# Patient Record
Sex: Female | Born: 1960 | Race: White | Hispanic: No | Smoking: Current every day smoker
Health system: Southern US, Community
[De-identification: ages and names within clinical notes are randomized; demographics above are authoritative.]

## PROBLEM LIST (undated history)

## (undated) DIAGNOSIS — I10 Essential (primary) hypertension: Secondary | ICD-10-CM

## (undated) DIAGNOSIS — T7840XA Allergy, unspecified, initial encounter: Secondary | ICD-10-CM

## (undated) DIAGNOSIS — M5020 Other cervical disc displacement, unspecified cervical region: Secondary | ICD-10-CM

## (undated) DIAGNOSIS — E785 Hyperlipidemia, unspecified: Secondary | ICD-10-CM

## (undated) DIAGNOSIS — F32A Depression, unspecified: Secondary | ICD-10-CM

## (undated) DIAGNOSIS — R51 Headache: Secondary | ICD-10-CM

## (undated) DIAGNOSIS — R519 Headache, unspecified: Secondary | ICD-10-CM

## (undated) DIAGNOSIS — B019 Varicella without complication: Secondary | ICD-10-CM

## (undated) DIAGNOSIS — F329 Major depressive disorder, single episode, unspecified: Secondary | ICD-10-CM

## (undated) HISTORY — DX: Major depressive disorder, single episode, unspecified: F32.9

## (undated) HISTORY — PX: APPENDECTOMY: SHX54

## (undated) HISTORY — DX: Allergy, unspecified, initial encounter: T78.40XA

## (undated) HISTORY — DX: Headache, unspecified: R51.9

## (undated) HISTORY — DX: Other cervical disc displacement, unspecified cervical region: M50.20

## (undated) HISTORY — DX: Headache: R51

## (undated) HISTORY — DX: Depression, unspecified: F32.A

## (undated) HISTORY — DX: Varicella without complication: B01.9

## (undated) HISTORY — DX: Hyperlipidemia, unspecified: E78.5

## (undated) HISTORY — DX: Essential (primary) hypertension: I10

## (undated) HISTORY — PX: DILATION AND CURETTAGE OF UTERUS: SHX78

---

## 1998-11-16 ENCOUNTER — Other Ambulatory Visit: Admission: RE | Admit: 1998-11-16 | Discharge: 1998-11-16 | Payer: Self-pay | Admitting: *Deleted

## 2000-03-30 ENCOUNTER — Other Ambulatory Visit: Admission: RE | Admit: 2000-03-30 | Discharge: 2000-03-30 | Payer: Self-pay | Admitting: Obstetrics and Gynecology

## 2005-04-15 ENCOUNTER — Ambulatory Visit: Payer: Self-pay | Admitting: Obstetrics and Gynecology

## 2006-04-27 ENCOUNTER — Ambulatory Visit: Payer: Self-pay | Admitting: Obstetrics and Gynecology

## 2007-10-05 ENCOUNTER — Ambulatory Visit: Payer: Self-pay | Admitting: Gastroenterology

## 2007-10-30 ENCOUNTER — Ambulatory Visit: Payer: Self-pay | Admitting: Gastroenterology

## 2008-06-24 ENCOUNTER — Ambulatory Visit: Payer: Self-pay | Admitting: Obstetrics and Gynecology

## 2013-09-25 ENCOUNTER — Ambulatory Visit: Payer: Self-pay | Admitting: Unknown Physician Specialty

## 2013-10-01 ENCOUNTER — Other Ambulatory Visit: Payer: Self-pay | Admitting: Family

## 2013-10-01 ENCOUNTER — Encounter: Payer: Self-pay | Admitting: Family

## 2013-10-01 ENCOUNTER — Ambulatory Visit (INDEPENDENT_AMBULATORY_CARE_PROVIDER_SITE_OTHER): Payer: 59 | Admitting: Family

## 2013-10-01 VITALS — BP 110/80 | HR 90 | Ht 64.0 in | Wt 131.0 lb

## 2013-10-01 DIAGNOSIS — F329 Major depressive disorder, single episode, unspecified: Secondary | ICD-10-CM

## 2013-10-01 DIAGNOSIS — F411 Generalized anxiety disorder: Secondary | ICD-10-CM

## 2013-10-01 DIAGNOSIS — R51 Headache: Secondary | ICD-10-CM

## 2013-10-01 DIAGNOSIS — I10 Essential (primary) hypertension: Secondary | ICD-10-CM

## 2013-10-01 DIAGNOSIS — F32A Depression, unspecified: Secondary | ICD-10-CM | POA: Insufficient documentation

## 2013-10-01 DIAGNOSIS — J309 Allergic rhinitis, unspecified: Secondary | ICD-10-CM | POA: Insufficient documentation

## 2013-10-01 LAB — CBC WITH DIFFERENTIAL/PLATELET
Basophils Absolute: 0 10*3/uL (ref 0.0–0.1)
Basophils Relative: 1 % (ref 0.0–3.0)
Eosinophils Absolute: 0.2 10*3/uL (ref 0.0–0.7)
Eosinophils Relative: 3.8 % (ref 0.0–5.0)
HCT: 37.4 % (ref 36.0–46.0)
Hemoglobin: 13.1 g/dL (ref 12.0–15.0)
Lymphocytes Relative: 47.1 % — ABNORMAL HIGH (ref 12.0–46.0)
Lymphs Abs: 2 10*3/uL (ref 0.7–4.0)
MCHC: 34.9 g/dL (ref 30.0–36.0)
MCV: 110.5 fl — ABNORMAL HIGH (ref 78.0–100.0)
Monocytes Absolute: 0.4 10*3/uL (ref 0.1–1.0)
Neutro Abs: 1.7 10*3/uL (ref 1.4–7.7)
Neutrophils Relative %: 39 % — ABNORMAL LOW (ref 43.0–77.0)
RBC: 3.39 Mil/uL — ABNORMAL LOW (ref 3.87–5.11)
RDW: 12.3 % (ref 11.5–14.6)

## 2013-10-01 MED ORDER — TRAMADOL HCL 50 MG PO TABS: 50.0000 mg | ORAL_TABLET | Freq: Three times a day (TID) | ORAL | Status: DC | PRN

## 2013-10-01 NOTE — Progress Notes (Signed)
Subjective:    Patient ID: Lori Molina, female    DOB: 1961/08/14, 52 y.o.   MRN: [id number]  HPI 52 year old white female, who nonsmoker is in today with complaints of a headache that occurs daily to the left upper occipital area of her head daily. She agrees and he cannot obtain. Some sensitivity to light and nontender legs. Has been taking Tylenol with no relief. She is also significant by him and that has not helped. Denies any blurred vision, double vision, nausea, vomiting, or phonophobia. Consumes about 5 cups of caffeine daily. He reports stress is actually better in her life. Last eye exam was 4 months ago she has a history of corneal implant. Has a history of migraine headaches in the past. She had an MRI performed regional physicians at [facility] that was normal. She also had a UA, CMP, lipids and A1c all done that were all normal.  Patient reports initiating antidepressants and antianxiety medications approximately 5 years ago when the days that she was running began to fail and her husband left her. Subsequently, her husband rod a convenient store in [place] and is now incarcerated. She divorced him this past year.  Review of Systems  Constitutional: Negative.   HENT: Negative for postnasal drip and sore throat.   Eyes: Negative.   Cardiovascular: Negative.   Gastrointestinal: Negative.   Endocrine: Negative.   Genitourinary: Negative.   Musculoskeletal: Negative.   Allergic/Immunologic: Negative.   Neurological: Positive for headaches. Negative for dizziness.  Psychiatric/Behavioral: Negative.    Past Medical History  Diagnosis Date  . Depression   . Allergy   . Hyperlipidemia   . Hypertension   . Frequent headaches   . Chicken pox     History   Social History  . Marital Status: Divorced    Spouse Name: N/A    Number of Children: N/A  . Years of Education: N/A   Occupational History  . Not on file.   Social History Main Topics  . Smoking status: Current  Every Day Smoker  . Smokeless tobacco: Not on file  . Alcohol Use: Yes     Comment: glass of wine nightly  . Drug Use: No  . Sexual Activity: Not on file   Other Topics Concern  . Not on file   Social History Narrative  . No narrative on file    Past Surgical History  Procedure Laterality Date  . Appendectomy    . Dilation and curettage of uterus      x 2    Family History  Problem Relation Age of Onset  . Hypertension Father     No Known Allergies  No current outpatient prescriptions on file prior to visit.   No current facility-administered medications on file prior to visit.    BP 110/80  Pulse 90  Ht 5\' 4"  (1.626 m)  Wt 131 lb (59.421 kg)  BMI 22.47 kg/m2chart    Objective:   Physical Exam  Constitutional: She is oriented to person, place, and time. She appears well-developed and well-nourished.  HENT:  Right Ear: External ear normal.  Left Ear: External ear normal.  Nose: Nose normal.  Mouth/Throat: Oropharynx is clear and moist.  Eyes: Conjunctivae are normal. Pupils are equal, round, and reactive to light.  Neck: Normal range of motion. Neck supple. No thyromegaly present.  Cardiovascular: Normal rate, regular rhythm and normal heart sounds.   Pulmonary/Chest: Effort normal and breath sounds normal.  Musculoskeletal: Normal range of motion.  Neurological: She is  alert and oriented to person, place, and time. She has normal reflexes. She displays normal reflexes. No cranial nerve deficit. Coordination normal.  Skin: Skin is warm and dry.  Psychiatric: She has a normal mood and affect.          Assessment & Plan:  Assessment: 1. Headache 2. Hypertension 3. Depression 4. Anxiety  Plan: TSH, ESR, and CBC sent. Will notify patient of results consider referral to neurology for headache if symptoms persist. Tylenol as needed for pain. Therefore narcotics. Decrease caffeine intake.

## 2013-10-01 NOTE — Patient Instructions (Signed)
Headache Headaches are caused by many different problems. Most commonly, headache is caused by muscle tension from an injury, fatigue, or emotional upset. Excessive muscle contractions in the scalp and neck result in a headache that often feels like a tight band around the head. Tension headaches often have areas of tenderness over the scalp and the back of the neck. These headaches may last for hours, days, or longer, and some may contribute to migraines in those who have migraine problems. Migraines usually cause a throbbing headache, which is made worse by activity. Sometimes only one side of the head hurts. Nausea, vomiting, eye pain, and avoidance of food are common with migraines. Visual symptoms such as light sensitivity, blind spots, or flashing lights may also occur. Loud noises may worsen migraine headaches. Many factors may cause migraine headaches:  Emotional stress, lack of sleep, and menstrual periods.   Alcohol and some drugs (such as birth control pills).   Diet factors (fasting, caffeine, food preservatives, chocolate).   Environmental factors (weather changes, bright lights, odors, smoke).  Other causes of headaches include minor injuries to the head. Arthritis in the neck; problems with the jaw, eyes, ears, or nose are also causes of headaches. Allergies, drugs, alcohol, and exposure to smoke can also cause moderate headaches. Rebound headaches can occur if someone uses pain medications for a long period of time and then stops. Less commonly, blood vessel problems in the neck and brain (including stroke) can cause various types of headache. Treatment of headaches includes medicines for pain and relaxation. Ice packs or heat applied to the back of the head and neck help some people. Massaging the shoulders, neck and scalp are often very useful. Relaxation techniques and stretching can help prevent these headaches. Avoid alcohol and cigarette smoking as these tend to make headaches  worse. Please see your caregiver if your headache is not better in 2 days.  SEEK IMMEDIATE MEDICAL CARE IF:   You develop a high fever, chills, or repeated vomiting.   You faint or have difficulty with vision.   You develop unusual numbness or weakness of your arms or legs.   Relief of pain is inadequate with medication, or you develop severe pain.   You develop confusion, or neck stiffness.   You have a worsening of a headache or do not obtain relief.  Document Released: 11/21/2005 Document Revised: 11/10/2011 Document Reviewed: 05/17/2007 ExitCare Patient Information 2012 [employer]. 

## 2013-10-18 ENCOUNTER — Encounter: Payer: Self-pay | Admitting: Neurology

## 2013-10-18 ENCOUNTER — Ambulatory Visit (INDEPENDENT_AMBULATORY_CARE_PROVIDER_SITE_OTHER): Payer: 59 | Admitting: Neurology

## 2013-10-18 VITALS — BP 138/74 | HR 98 | Temp 98.0°F | Ht 64.0 in | Wt 130.0 lb

## 2013-10-18 DIAGNOSIS — G4485 Primary stabbing headache: Secondary | ICD-10-CM

## 2013-10-18 DIAGNOSIS — G4452 New daily persistent headache (NDPH): Secondary | ICD-10-CM

## 2013-10-18 MED ORDER — AMITRIPTYLINE HCL 25 MG PO TABS: 25.0000 mg | ORAL_TABLET | Freq: Every day | ORAL | Status: DC

## 2013-10-18 MED ORDER — PREDNISONE 10 MG PO TABS: ORAL_TABLET | ORAL | Status: DC

## 2013-10-18 NOTE — Progress Notes (Addendum)
NEUROLOGY CONSULTATION NOTE  Raechel Marcos MRN: [id number] DOB: Feb 19, 1961  Referring provider: Adline Mango Primary care provider: Adline Mango  Reason for consult:  Headache  HISTORY OF PRESENT ILLNESS: Lori Molina is a 52 year old right-handed woman with history of depression, hypertension, and hyperlipidemia who presents for headache.  Records and images were personally reviewed where available.    Onset:  End of Sept/early Oct 2014.  Just developed it. Location:  left upper occipital area Quality:  stabbing Intensity:  6-10/10 Aura:  no Associated symptoms:  Mild photophobia.  No nausea, vomiting, visual disturbance, photophobia, phonophobia, phonophobia, lacrimation, nasal congestion.  Duration:  constant Frequency: daily Triggers:  none Relieving factors:  none Activity:  Able to work  Abortive therapy:  Tylenol (ineffective), Tramadol (ineffective).  Does not want to take ASA, steroids, or NSAIDs due to stress-induced abdominal pain (no history of ulcer or GERD).  Chiropractor for neck pain, which didn't help headache. Preventative therapy:  none  Alcohol:  no Sleep hygiene:  Somewhat poor Stress/depression:  yes History of headache:  Remote history of catemenial migraines.  09/25/13:  MRI BRAIN WO CONTRAST (images not available) very minimal areas of increased FLAIR signal in the subcortical white matter which is nonspecific the statistically most likely secondary to chronic microvascular ischemic disease.  No acute abnormality otherwise. 10/01/13:  TSH 1.33, ESR 21.  PAST MEDICAL HISTORY: Past Medical History  Diagnosis Date  . Depression   . Allergy   . Hyperlipidemia   . Hypertension   . Frequent headaches   . Chicken pox     PAST SURGICAL HISTORY: Past Surgical History  Procedure Laterality Date  . Appendectomy    . Dilation and curettage of uterus      x 2    MEDICATIONS: Current Outpatient Prescriptions on File Prior to Visit    Medication Sig Dispense Refill  . ALPRAZolam (XANAX) 0.25 MG tablet Take 0.25 mg by mouth as needed for sleep.      . cetirizine (ZYRTEC) 10 MG tablet Take 10 mg by mouth daily.      . cyclobenzaprine (FLEXERIL) 5 MG tablet Take 5 mg by mouth 3 (three) times daily as needed for muscle spasms.      Marland Kitchen lisinopril-hydrochlorothiazide (PRINZIDE,ZESTORETIC) 20-25 MG per tablet Take 1 tablet by mouth daily.      . sertraline (ZOLOFT) 50 MG tablet Take 50 mg by mouth daily.      . traMADol (ULTRAM) 50 MG tablet Take 1 tablet (50 mg total) by mouth every 8 (eight) hours as needed for pain.  30 tablet  0  . atorvastatin (LIPITOR) 40 MG tablet Take 40 mg by mouth daily.      Marland Kitchen lisinopril (PRINIVIL,ZESTRIL) 10 MG tablet Take 10 mg by mouth daily.       No current facility-administered medications on file prior to visit.    ALLERGIES: No Known Allergies  FAMILY HISTORY: Family History  Problem Relation Age of Onset  . Hypertension Father     SOCIAL HISTORY: History   Social History  . Marital Status: Divorced    Spouse Name: N/A    Number of Children: N/A  . Years of Education: N/A   Occupational History  . Not on file.   Social History Main Topics  . Smoking status: Current Every Day Smoker  . Smokeless tobacco: Not on file  . Alcohol Use: Yes     Comment: glass of wine nightly  . Drug Use: No  . Sexual Activity:  Not on file   Other Topics Concern  . Not on file   Social History Narrative  . No narrative on file    REVIEW OF SYSTEMS: Constitutional: No fevers, chills, or sweats, no generalized fatigue, change in appetite Eyes: No visual changes, double vision, eye pain Ear, nose and throat: No hearing loss, ear pain, nasal congestion, sore throat Cardiovascular: No chest pain, palpitations Respiratory:  No shortness of breath at rest or with exertion, wheezes GastrointestinaI: No nausea, vomiting, diarrhea, abdominal pain, fecal incontinence Genitourinary:  No dysuria,  urinary retention or frequency Musculoskeletal:  No neck pain, back pain Integumentary: No rash, pruritus, skin lesions Neurological: as above Psychiatric: No depression, insomnia, anxiety Endocrine: No palpitations, fatigue, diaphoresis, mood swings, change in appetite, change in weight, increased thirst Hematologic/Lymphatic:  No anemia, purpura, petechiae. Allergic/Immunologic: no itchy/runny eyes, nasal congestion, recent allergic reactions, rashes  PHYSICAL EXAM: Filed Vitals:   10/18/13 0839  BP: 138/74  Pulse: 98  Temp: 98 F (36.7 C)   General: No acute distress Head:  Normocephalic/atraumatic Neck: supple, mild paraspinal tenderness but no suboccipital tenderness at occipital notch, full range of motion Back: No paraspinal tenderness Heart: regular rate and rhythm Lungs: Clear to auscultation bilaterally. Vascular: No carotid bruits. Neurological Exam: Mental status: alert and oriented to person, place, and time, speech fluent and not dysarthric, language intact. Cranial nerves: CN I: not tested CN II: pupils equal, round and reactive to light, visual fields intact, fundi unremarkable. CN III, IV, VI:  full range of motion, no nystagmus, no ptosis CN V: facial sensation intact CN VII: upper and lower face symmetric CN VIII: hearing intact CN IX, X: gag intact, uvula midline CN XI: sternocleidomastoid and trapezius muscles intact CN XII: tongue midline Bulk & Tone: normal, no fasciculations. Motor: 5/5 throughout Sensation: temperature and vibration intact Deep Tendon Reflexes: 2+ throughout, toes down Finger to nose testing: normal Heel to shin: normal Gait: Normal stance and stride.  Able to walk in tandem. Romberg negative.  IMPRESSION: New-daily persistent headache/stabbing headache.  PLAN: 1.  Amitriptyline 25mg  qhs.  Call in one month with update. 2.  Agreeable to prednisone taper. 3.  If severe, will have to consider NSAIDs.  We can add omeprazole for  prophylaxis if concern of stomach. 4.  Follow up in 3 months.   Thank you for allowing me to take part in the care of this patient.  Shon Millet, DO  CC:  Adline Mango

## 2013-10-18 NOTE — Patient Instructions (Addendum)
You seem to have a stabbing headache or new-daily persistent headache.   1.  We will start amitriptyline 25mg  at bedtime.  Side effects may include sleepiness or dizziness.  Call in one month with update and we can adjust dose as needed. 2.  We will start a 6 day course of prednisone to help break cycle of headache.  Take 6 pills for one day, then 5pills for one day, then 4pills for one day, then 3 pills for one day, then 2 pills for one day, then 1 pill for one day then stop. 3.  Stop the tramadol.  If you decide to use an NSAID (like advil or naproxen), you may but you would you would have to limit use to 1 or 2 days out of the week to prevent medication overuse headache. 4.  Follow up in 3 months.

## 2014-01-24 ENCOUNTER — Telehealth: Payer: Self-pay | Admitting: Neurology

## 2014-01-24 ENCOUNTER — Encounter: Payer: Self-pay | Admitting: Neurology

## 2014-01-24 ENCOUNTER — Ambulatory Visit: Payer: 59 | Admitting: Neurology

## 2014-01-24 NOTE — Telephone Encounter (Signed)
Pt no showed today's follow up appt w/ dr. Everlena CooperJaffe. No show letter mailed to pt / Sherri S.

## 2014-02-07 ENCOUNTER — Ambulatory Visit (INDEPENDENT_AMBULATORY_CARE_PROVIDER_SITE_OTHER): Payer: 59 | Admitting: Family

## 2014-02-07 ENCOUNTER — Encounter: Payer: Self-pay | Admitting: Family

## 2014-02-07 VITALS — BP 108/76 | HR 107 | Wt 136.0 lb

## 2014-02-07 DIAGNOSIS — F3289 Other specified depressive episodes: Secondary | ICD-10-CM

## 2014-02-07 DIAGNOSIS — I10 Essential (primary) hypertension: Secondary | ICD-10-CM

## 2014-02-07 DIAGNOSIS — F329 Major depressive disorder, single episode, unspecified: Secondary | ICD-10-CM

## 2014-02-07 DIAGNOSIS — J309 Allergic rhinitis, unspecified: Secondary | ICD-10-CM

## 2014-02-07 DIAGNOSIS — F411 Generalized anxiety disorder: Secondary | ICD-10-CM

## 2014-02-07 MED ORDER — ALPRAZOLAM 0.25 MG PO TABS: 0.2500 mg | ORAL_TABLET | ORAL | Status: DC | PRN

## 2014-02-07 MED ORDER — SERTRALINE HCL 100 MG PO TABS: 100.0000 mg | ORAL_TABLET | Freq: Every day | ORAL | Status: DC

## 2014-02-07 NOTE — Patient Instructions (Signed)
Exercise to Stay Healthy Exercise helps you become and stay healthy. EXERCISE IDEAS AND TIPS Choose exercises that:  You enjoy.  Fit into your day. You do not need to exercise really hard to be healthy. You can do exercises at a slow or medium level and stay healthy. You can:  Stretch before and after working out.  Try yoga, Pilates, or tai chi.  Lift weights.  Walk fast, swim, jog, run, climb stairs, bicycle, dance, or rollerskate.  Take aerobic classes. Exercises that burn about 150 calories:  Running 1  miles in 15 minutes.  Playing volleyball for 45 to 60 minutes.  Washing and waxing a car for 45 to 60 minutes.  Playing touch football for 45 minutes.  Walking 1  miles in 35 minutes.  Pushing a stroller 1  miles in 30 minutes.  Playing basketball for 30 minutes.  Raking leaves for 30 minutes.  Bicycling 5 miles in 30 minutes.  Walking 2 miles in 30 minutes.  Dancing for 30 minutes.  Shoveling snow for 15 minutes.  Swimming laps for 20 minutes.  Walking up stairs for 15 minutes.  Bicycling 4 miles in 15 minutes.  Gardening for 30 to 45 minutes.  Jumping rope for 15 minutes.  Washing windows or floors for 45 to 60 minutes. Document Released: 12/24/2010 Document Revised: 02/13/2012 Document Reviewed: 12/24/2010 ExitCare Patient Information 2014 [employer].  

## 2014-02-07 NOTE — Progress Notes (Signed)
Pre visit review using our clinic review tool, if applicable. No additional management support is needed unless otherwise documented below in the visit note. 

## 2014-02-07 NOTE — Progress Notes (Signed)
Subjective:    Patient ID: Lori Molina, female    DOB: [birth year], 53 y.o.   MRN: [id number]  HPI 53 year old caucasian female, nonsmoker presents today for follow-up for hypertension, allergic rhinitis, depression, and generalized anxiety disorder.  She has increased anxiety over the last few weeks.  She bailed her son out of jail for his second DUI.  She is also having increased stress due to her job.  The workload has increased and she has been working 60 to 70 hours per week.  She develops a headache everyday prior to going to work.  She says she knows it is stress.  [place]    Review of Systems  Constitutional: Negative.   HENT: Negative.   Respiratory: Negative.   Cardiovascular: Negative.   Gastrointestinal: Negative.   Genitourinary: Negative.   Skin: Negative.   Allergic/Immunologic: Negative.   Neurological: Negative.  Negative for headaches.       Denies headache  Psychiatric/Behavioral: The patient is nervous/anxious.        Increases anxiety and stress due to job and son   Past Medical History  Diagnosis Date  . Depression   . Allergy   . Hyperlipidemia   . Hypertension   . Frequent headaches   . Chicken pox     History   Social History  . Marital Status: Divorced    Spouse Name: N/A    Number of Children: N/A  . Years of Education: N/A   Occupational History  . Not on file.   Social History Main Topics  . Smoking status: Current Every Day Smoker  . Smokeless tobacco: Not on file  . Alcohol Use: Yes     Comment: glass of wine nightly  . Drug Use: No  . Sexual Activity: Not on file   Other Topics Concern  . Not on file   Social History Narrative  . No narrative on file    Past Surgical History  Procedure Laterality Date  . Appendectomy    . Dilation and curettage of uterus      x 2    Family History  Problem Relation Age of Onset  . Hypertension Father     No  Known Allergies  Current Outpatient Prescriptions on File Prior to Visit  Medication Sig Dispense Refill  . amitriptyline (ELAVIL) 25 MG tablet Take 1 tablet (25 mg total) by mouth at bedtime.  30 tablet  3  . atorvastatin (LIPITOR) 40 MG tablet Take 40 mg by mouth daily.      . cyclobenzaprine (FLEXERIL) 5 MG tablet Take 5 mg by mouth 3 (three) times daily as needed for muscle spasms.      Marland Kitchen. lisinopril-hydrochlorothiazide (PRINZIDE,ZESTORETIC) 20-25 MG per tablet Take 1 tablet by mouth daily.      . cetirizine (ZYRTEC) 10 MG tablet Take 10 mg by mouth daily.      Marland Kitchen. lisinopril (PRINIVIL,ZESTRIL) 10 MG tablet Take 10 mg by mouth daily.      . predniSONE (DELTASONE) 10 MG tablet Take 6tbs x1d, then 5tbs x1d, then 4tbs x1d, then 3tbs x1d, then 2tbs x1d, then 1tb x1d, then STOP  21 tablet  0  . traMADol (ULTRAM) 50 MG tablet Take 1 tablet (50 mg total) by mouth every 8 (eight) hours as needed for pain.  30 tablet  0   No current facility-administered medications on file prior to visit.  BP 108/76  Pulse 107  Wt 136 lb (61.689 kg)chart    Objective:   Physical Exam  Constitutional: She is oriented to person, place, and time. She appears well-developed and well-nourished.  HENT:  Denies headache  Neck: Normal range of motion. Neck supple.  Cardiovascular: Normal rate, regular rhythm and normal heart sounds.   Pulmonary/Chest: Effort normal and breath sounds normal.  Abdominal: Soft. Bowel sounds are normal.  Musculoskeletal: Normal range of motion.  Neurological: She is alert and oriented to person, place, and time.  Skin: Skin is warm and dry.  Psychiatric: She has a normal mood and affect.  Increased anxiety noted.  Patient teary eyed when talking about work and her son          Assessment & Plan:  Assessment 1. Hypertension 2. Rhinitis 3. Depression 4. Anxiety Disorder  Plan 1. Refill alprazolam 0.25 tab PRN. 2. Increase Zoloft to 100 mg daily. 3. Encourage exercise  regime to increase serotonin. 4. Referral to Therapist.  5.Decrease Elavil 25 mg to every other day x 1 week and then discontinue.   6. Continue current medications as ordered. 7.  Contact office for questions or concerns. Recheck in 4 weeks.

## 2014-02-08 ENCOUNTER — Telehealth: Payer: Self-pay | Admitting: Family

## 2014-02-08 NOTE — Telephone Encounter (Signed)
Relevant patient education assigned to patient using Emmi. ° °

## 2014-02-10 ENCOUNTER — Telehealth: Payer: Self-pay | Admitting: Family

## 2014-02-10 NOTE — Telephone Encounter (Signed)
Relevant patient education assigned to patient using Emmi. ° °

## 2014-02-13 ENCOUNTER — Ambulatory Visit (INDEPENDENT_AMBULATORY_CARE_PROVIDER_SITE_OTHER): Payer: 59 | Admitting: Licensed Clinical Social Worker

## 2014-02-13 DIAGNOSIS — F4321 Adjustment disorder with depressed mood: Secondary | ICD-10-CM

## 2014-02-13 DIAGNOSIS — F411 Generalized anxiety disorder: Secondary | ICD-10-CM

## 2014-02-24 ENCOUNTER — Ambulatory Visit (INDEPENDENT_AMBULATORY_CARE_PROVIDER_SITE_OTHER): Payer: 59 | Admitting: Licensed Clinical Social Worker

## 2014-02-24 DIAGNOSIS — F411 Generalized anxiety disorder: Secondary | ICD-10-CM

## 2014-02-24 DIAGNOSIS — F4321 Adjustment disorder with depressed mood: Secondary | ICD-10-CM

## 2014-03-03 ENCOUNTER — Telehealth: Payer: Self-pay | Admitting: *Deleted

## 2014-03-03 NOTE — Telephone Encounter (Signed)
RX called  In for amitriptyline hcl 25mg  #30 1 po @[facility]  no refills patient needs to make a follow up appt

## 2014-03-13 ENCOUNTER — Ambulatory Visit: Payer: 59 | Admitting: Licensed Clinical Social Worker

## 2014-03-14 ENCOUNTER — Ambulatory Visit (INDEPENDENT_AMBULATORY_CARE_PROVIDER_SITE_OTHER): Payer: 59 | Admitting: Family

## 2014-03-14 ENCOUNTER — Ambulatory Visit (INDEPENDENT_AMBULATORY_CARE_PROVIDER_SITE_OTHER): Payer: 59 | Admitting: Licensed Clinical Social Worker

## 2014-03-14 ENCOUNTER — Encounter: Payer: Self-pay | Admitting: Family

## 2014-03-14 VITALS — BP 110/80 | HR 86 | Wt 135.0 lb

## 2014-03-14 DIAGNOSIS — F4321 Adjustment disorder with depressed mood: Secondary | ICD-10-CM

## 2014-03-14 DIAGNOSIS — F411 Generalized anxiety disorder: Secondary | ICD-10-CM

## 2014-03-14 DIAGNOSIS — J309 Allergic rhinitis, unspecified: Secondary | ICD-10-CM

## 2014-03-14 DIAGNOSIS — F329 Major depressive disorder, single episode, unspecified: Secondary | ICD-10-CM

## 2014-03-14 DIAGNOSIS — F3289 Other specified depressive episodes: Secondary | ICD-10-CM

## 2014-03-14 MED ORDER — DIAZEPAM 5 MG PO TABS: 5.0000 mg | ORAL_TABLET | Freq: Two times a day (BID) | ORAL | Status: DC | PRN

## 2014-03-14 MED ORDER — CYCLOBENZAPRINE HCL 5 MG PO TABS: 5.0000 mg | ORAL_TABLET | Freq: Three times a day (TID) | ORAL | Status: DC | PRN

## 2014-03-14 NOTE — Progress Notes (Signed)
Subjective:    Patient ID: Lori Molina, female    DOB: [birth year], 53 y.o.   MRN: [id number]  HPI 53 year old white female, in today for recheck of depression and anxiety. We discontinue Elavil and started Zoloft 100 mg twice a day and she is doing well.   Has complaints of nasal congestion, allergic rhinitis, and is requesting medication to help. She recently lost her job and appears to be coping well. Would like to have something for anxiety intermittently. She will be going for job interviews next week. Is also requesting a refill on Flexeril for chronic low back pain.     Review of Systems  Constitutional: Negative.   HENT: Negative.   Respiratory: Negative.   Cardiovascular: Negative.   Gastrointestinal: Negative.   Endocrine: Negative.   Genitourinary: Negative.   Musculoskeletal: Negative.   Skin: Negative.   Neurological: Negative.   Hematological: Negative.   Psychiatric/Behavioral: Negative.    Past Medical History  Diagnosis Date  . Depression   . Allergy   . Hyperlipidemia   . Hypertension   . Frequent headaches   . Chicken pox     History   Social History  . Marital Status: Divorced    Spouse Name: N/A    Number of Children: N/A  . Years of Education: N/A   Occupational History  . Not on file.   Social History Main Topics  . Smoking status: Current Every Day Smoker  . Smokeless tobacco: Not on file  . Alcohol Use: Yes     Comment: glass of wine nightly  . Drug Use: No  . Sexual Activity: Not on file   Other Topics Concern  . Not on file   Social History Narrative  . No narrative on file    Past Surgical History  Procedure Laterality Date  . Appendectomy    . Dilation and curettage of uterus      x 2    Family History  Problem Relation Age of Onset  . Hypertension Father     No Known Allergies  Current Outpatient Prescriptions on File Prior to Visit  Medication Sig Dispense Refill  . atorvastatin (LIPITOR) 40 MG tablet Take  40 mg by mouth daily.      . cetirizine (ZYRTEC) 10 MG tablet Take 10 mg by mouth daily.      Marland Kitchen. lisinopril-hydrochlorothiazide (PRINZIDE,ZESTORETIC) 20-25 MG per tablet Take 1 tablet by mouth daily.      . sertraline (ZOLOFT) 100 MG tablet Take 1 tablet (100 mg total) by mouth daily.  30 tablet  3  . traMADol (ULTRAM) 50 MG tablet Take 1 tablet (50 mg total) by mouth every 8 (eight) hours as needed for pain.  30 tablet  0   No current facility-administered medications on file prior to visit.    BP 110/80  Pulse 86  Wt 135 lb (61.236 kg)chart    Objective:   Physical Exam  Constitutional: She is oriented to person, place, and time. She appears well-developed and well-nourished.  Neck: Normal range of motion. Neck supple.  Cardiovascular: Normal rate, regular rhythm and normal heart sounds.   Pulmonary/Chest: Breath sounds normal.  Abdominal: Soft. Bowel sounds are normal.  Musculoskeletal: Normal range of motion.  Neurological: She is alert and oriented to person, place, and time.  Skin: Skin is warm and dry.  Psychiatric: She has a normal mood and affect.          Assessment & Plan:  Lori Molina was seen today  for follow-up and anxiety.  Diagnoses and associated orders for this visit:  Generalized anxiety disorder  Depressive disorder, not elsewhere classified  Allergic rhinitis  Other Orders - diazepam (VALIUM) 5 MG tablet; Take 1 tablet (5 mg total) by mouth every 12 (twelve) hours as needed for anxiety. - cyclobenzaprine (FLEXERIL) 5 MG tablet; Take 1 tablet (5 mg total) by mouth 3 (three) times daily as needed for muscle spasms.   Call the office with any questions or concerns. Recheck in 3 months and sooner as needed.

## 2014-03-14 NOTE — Progress Notes (Signed)
Pre visit review using our clinic review tool, if applicable. No additional management support is needed unless otherwise documented below in the visit note. 

## 2014-03-14 NOTE — Patient Instructions (Signed)

## 2014-03-28 ENCOUNTER — Ambulatory Visit (INDEPENDENT_AMBULATORY_CARE_PROVIDER_SITE_OTHER): Payer: 59 | Admitting: Licensed Clinical Social Worker

## 2014-03-28 DIAGNOSIS — F4321 Adjustment disorder with depressed mood: Secondary | ICD-10-CM

## 2014-03-28 DIAGNOSIS — F411 Generalized anxiety disorder: Secondary | ICD-10-CM

## 2014-04-18 ENCOUNTER — Ambulatory Visit (INDEPENDENT_AMBULATORY_CARE_PROVIDER_SITE_OTHER): Payer: 59 | Admitting: Licensed Clinical Social Worker

## 2014-04-18 DIAGNOSIS — F4321 Adjustment disorder with depressed mood: Secondary | ICD-10-CM

## 2014-04-18 DIAGNOSIS — F411 Generalized anxiety disorder: Secondary | ICD-10-CM

## 2014-04-30 ENCOUNTER — Encounter: Payer: Self-pay | Admitting: Family

## 2014-04-30 ENCOUNTER — Other Ambulatory Visit (HOSPITAL_COMMUNITY)
Admission: RE | Admit: 2014-04-30 | Discharge: 2014-04-30 | Disposition: A | Discharge status: Home / Self Care | Payer: 59 | Source: Ambulatory Visit | Attending: Family | Admitting: Family

## 2014-04-30 ENCOUNTER — Ambulatory Visit (INDEPENDENT_AMBULATORY_CARE_PROVIDER_SITE_OTHER): Payer: 59 | Admitting: Family

## 2014-04-30 VITALS — BP 100/60 | HR 98 | Temp 98.7°F | Wt 133.0 lb

## 2014-04-30 DIAGNOSIS — Z01419 Encounter for gynecological examination (general) (routine) without abnormal findings: Secondary | ICD-10-CM | POA: Insufficient documentation

## 2014-04-30 DIAGNOSIS — Z124 Encounter for screening for malignant neoplasm of cervix: Secondary | ICD-10-CM

## 2014-04-30 NOTE — Progress Notes (Signed)
Subjective:    Patient ID: Lori Molina, female    DOB: [birth year], 53 y.o.   MRN: [id number]  HPI 53 year old WF, nonsmoker is in today for a pelvic exam.    Review of Systems  Constitutional: Negative.   Respiratory: Negative.   Cardiovascular: Negative.   Genitourinary: Negative.   Skin: Negative.   Allergic/Immunologic: Negative.   Psychiatric/Behavioral: Negative.    Past Medical History  Diagnosis Date  . Depression   . Allergy   . Hyperlipidemia   . Hypertension   . Frequent headaches   . Chicken pox     History   Social History  . Marital Status: Divorced    Spouse Name: N/A    Number of Children: N/A  . Years of Education: N/A   Occupational History  . Not on file.   Social History Main Topics  . Smoking status: Current Every Day Smoker  . Smokeless tobacco: Not on file  . Alcohol Use: Yes     Comment: glass of wine nightly  . Drug Use: No  . Sexual Activity: Not on file   Other Topics Concern  . Not on file   Social History Narrative  . No narrative on file    Past Surgical History  Procedure Laterality Date  . Appendectomy    . Dilation and curettage of uterus      x 2    Family History  Problem Relation Age of Onset  . Hypertension Father     No Known Allergies  Current Outpatient Prescriptions on File Prior to Visit  Medication Sig Dispense Refill  . atorvastatin (LIPITOR) 40 MG tablet Take 40 mg by mouth daily.      . cetirizine (ZYRTEC) 10 MG tablet Take 10 mg by mouth daily.      . cyclobenzaprine (FLEXERIL) 5 MG tablet Take 1 tablet (5 mg total) by mouth 3 (three) times daily as needed for muscle spasms.  60 tablet  3  . diazepam (VALIUM) 5 MG tablet Take 1 tablet (5 mg total) by mouth every 12 (twelve) hours as needed for anxiety.  60 tablet  1  . lisinopril-hydrochlorothiazide (PRINZIDE,ZESTORETIC) 20-25 MG per tablet Take 1 tablet by mouth daily.      . sertraline (ZOLOFT) 100 MG tablet Take 1 tablet (100 mg total) by  mouth daily.  30 tablet  3  . traMADol (ULTRAM) 50 MG tablet Take 1 tablet (50 mg total) by mouth every 8 (eight) hours as needed for pain.  30 tablet  0   No current facility-administered medications on file prior to visit.    BP 100/60  Pulse 98  Temp(Src) 98.7 F (37.1 C) (Oral)  Wt 133 lb (60.328 kg)  SpO2 97%chart    Objective:   Physical Exam  Constitutional: She is oriented to person, place, and time. She appears well-developed and well-nourished.  Cardiovascular: Normal rate, regular rhythm and normal heart sounds.   Pulmonary/Chest: Breath sounds normal.  Abdominal: Soft. Bowel sounds are normal.  Genitourinary: Vagina normal and uterus normal. No vaginal discharge found.  Neurological: She is alert and oriented to person, place, and time.  Skin: Skin is warm.  Psychiatric: She has a normal mood and affect.          Assessment & Plan:  Lori Molina was seen today for gynecologic exam.  Diagnoses and associated orders for this visit:  Screening for malignant neoplasm of the cervix   Call the office with any questions or concerns.  Recheck in 1 year as needed.

## 2014-04-30 NOTE — Patient Instructions (Signed)

## 2014-04-30 NOTE — Progress Notes (Signed)
Pre visit review using our clinic review tool, if applicable. No additional management support is needed unless otherwise documented below in the visit note. 

## 2014-05-02 ENCOUNTER — Ambulatory Visit (INDEPENDENT_AMBULATORY_CARE_PROVIDER_SITE_OTHER): Payer: 59 | Admitting: Licensed Clinical Social Worker

## 2014-05-02 DIAGNOSIS — F4321 Adjustment disorder with depressed mood: Secondary | ICD-10-CM

## 2014-05-02 DIAGNOSIS — F411 Generalized anxiety disorder: Secondary | ICD-10-CM

## 2014-06-19 ENCOUNTER — Other Ambulatory Visit: Payer: Self-pay | Admitting: Family

## 2014-09-16 ENCOUNTER — Other Ambulatory Visit: Payer: Self-pay | Admitting: Family

## 2014-12-26 ENCOUNTER — Ambulatory Visit: Payer: Self-pay | Admitting: Family

## 2015-02-26 ENCOUNTER — Other Ambulatory Visit: Payer: Self-pay | Admitting: Family

## 2015-03-04 ENCOUNTER — Ambulatory Visit (INDEPENDENT_AMBULATORY_CARE_PROVIDER_SITE_OTHER): Payer: BLUE CROSS/BLUE SHIELD | Admitting: Family

## 2015-03-04 ENCOUNTER — Encounter: Payer: Self-pay | Admitting: Family

## 2015-03-04 VITALS — BP 120/90 | HR 107 | Temp 98.3°F | Ht 63.0 in | Wt 139.0 lb

## 2015-03-04 DIAGNOSIS — Z720 Tobacco use: Secondary | ICD-10-CM | POA: Diagnosis not present

## 2015-03-04 DIAGNOSIS — I10 Essential (primary) hypertension: Secondary | ICD-10-CM | POA: Diagnosis not present

## 2015-03-04 DIAGNOSIS — F411 Generalized anxiety disorder: Secondary | ICD-10-CM | POA: Diagnosis not present

## 2015-03-04 DIAGNOSIS — F17200 Nicotine dependence, unspecified, uncomplicated: Secondary | ICD-10-CM

## 2015-03-04 MED ORDER — BUPROPION HCL ER (XL) 150 MG PO TB24: 150.0000 mg | ORAL_TABLET | Freq: Every day | ORAL | Status: DC

## 2015-03-04 MED ORDER — LISINOPRIL-HYDROCHLOROTHIAZIDE 20-25 MG PO TABS: 1.0000 | ORAL_TABLET | Freq: Every day | ORAL | Status: DC

## 2015-03-04 MED ORDER — DIAZEPAM 5 MG PO TABS: 5.0000 mg | ORAL_TABLET | Freq: Two times a day (BID) | ORAL | Status: DC | PRN

## 2015-03-04 NOTE — Progress Notes (Signed)
Subjective:    Patient ID: Lori Molina, female    DOB: Jan 17, 1961, 54 y.o.   MRN: [id number]  HPI 54 y.o. White female presents today with complaints of anxiety, high blood pressure and wanting to quite smoking. Pt states that she has been more anxious due to her son going to jail, not enjoying work and selling her house. Her anxiety occurs occasionally but is severe when it happens. She has stopped taking the sertraline that was prescribed because she does not find it helpful. Pt states her blood pressure has been elevated due to her running out of her prescription for blood pressure medicine. She would like a prescription for something to help her stop smoking, she has tried on her own in the past but was not successful. Denies suicidal ideation. Denies fatigue, chills, fever and SOB.   Past Medical History  Diagnosis Date  . Depression   . Allergy   . Hyperlipidemia   . Hypertension   . Frequent headaches   . Chicken pox     History   Social History  . Marital Status: Divorced    Spouse Name: N/A  . Number of Children: N/A  . Years of Education: N/A   Occupational History  . Not on file.   Social History Main Topics  . Smoking status: Current Every Day Smoker  . Smokeless tobacco: Not on file  . Alcohol Use: Yes     Comment: glass of wine nightly  . Drug Use: No  . Sexual Activity: Not on file   Other Topics Concern  . Not on file   Social History Narrative    Past Surgical History  Procedure Laterality Date  . Appendectomy    . Dilation and curettage of uterus      x 2    Family History  Problem Relation Age of Onset  . Hypertension Father     No Known Allergies  Current Outpatient Prescriptions on File Prior to Visit  Medication Sig Dispense Refill  . atorvastatin (LIPITOR) 40 MG tablet Take 40 mg by mouth daily.    . cetirizine (ZYRTEC) 10 MG tablet Take 10 mg by mouth daily.    . traMADol (ULTRAM) 50 MG tablet Take 1 tablet (50 mg total) by  mouth every 8 (eight) hours as needed for pain. (Patient not taking: Reported on 03/04/2015) 30 tablet 0   No current facility-administered medications on file prior to visit.    BP 120/90 mmHg  Pulse 107  Temp(Src) 98.3 F (36.8 C) (Oral)  Ht  (1.6 m)  Wt 139 lb (63.05 kg)  BMI 24.63 kg/m2chart  Review of Systems  Constitutional: Negative.  Negative for fever, chills, activity change, appetite change and fatigue.  Respiratory: Negative.  Negative for cough, chest tightness and shortness of breath.   Cardiovascular: Negative.  Negative for chest pain.  Neurological: Negative.  Negative for dizziness, light-headedness and headaches.  Psychiatric/Behavioral: The patient is nervous/anxious.        Objective:   Physical Exam  Constitutional: She is oriented to person, place, and time. She appears well-developed and well-nourished. She is active.  Cardiovascular: Normal rate, regular rhythm, normal heart sounds and normal pulses.   Pulmonary/Chest: Effort normal and breath sounds normal.  Abdominal: Soft. Normal appearance and bowel sounds are normal.  Neurological: She is alert and oriented to person, place, and time. She has normal reflexes.  Psychiatric: Her speech is normal and behavior is normal. Judgment and thought content normal. Her  mood appears anxious. Cognition and memory are normal. She exhibits a depressed mood.          Assessment & Plan:  Lori Molina was seen today for follow-up, anxiety and nicotine dependence.  Diagnoses and all orders for this visit:  Essential hypertension Orders: -     Lipid Panel -     Basic Metabolic Panel  Generalized anxiety disorder  Smoking  Other orders -     buPROPion (WELLBUTRIN XL) 150 MG 24 hr tablet; Take 1 tablet (150 mg total) by mouth daily. -     lisinopril-hydrochlorothiazide (PRINZIDE,ZESTORETIC) 20-25 MG per tablet; Take 1 tablet by mouth daily. -     diazepam (VALIUM) 5 MG tablet; Take 1 tablet (5 mg total) by  mouth every 12 (twelve) hours as needed. for anxiety  Will follow up as needed.

## 2015-03-04 NOTE — Progress Notes (Signed)
Pre visit review using our clinic review tool, if applicable. No additional management support is needed unless otherwise documented below in the visit note. 

## 2015-03-04 NOTE — Patient Instructions (Signed)
Generalized Anxiety Disorder Generalized anxiety disorder (GAD) is a mental disorder. It interferes with life functions, including relationships, work, and school. GAD is different from normal anxiety, which everyone experiences at some point in their lives in response to specific life events and activities. Normal anxiety actually helps us prepare for and get through these life events and activities. Normal anxiety goes away after the event or activity is over.  GAD causes anxiety that is not necessarily related to specific events or activities. It also causes excess anxiety in proportion to specific events or activities. The anxiety associated with GAD is also difficult to control. GAD can vary from mild to severe. People with severe GAD can have intense waves of anxiety with physical symptoms (panic attacks).  SYMPTOMS The anxiety and worry associated with GAD are difficult to control. This anxiety and worry are related to many life events and activities and also occur more days than not for 6 months or longer. People with GAD also have three or more of the following symptoms (one or more in children):  Restlessness.   Fatigue.  Difficulty concentrating.   Irritability.  Muscle tension.  Difficulty sleeping or unsatisfying sleep. DIAGNOSIS GAD is diagnosed through an assessment by your health care provider. Your health care provider will ask you questions aboutyour mood,physical symptoms, and events in your life. Your health care provider may ask you about your medical history and use of alcohol or drugs, including prescription medicines. Your health care provider may also do a physical exam and blood tests. Certain medical conditions and the use of certain substances can cause symptoms similar to those associated with GAD. Your health care provider may refer you to a mental health specialist for further evaluation. TREATMENT The following therapies are usually used to treat GAD:    Medication. Antidepressant medication usually is prescribed for long-term daily control. Antianxiety medicines may be added in severe cases, especially when panic attacks occur.   Talk therapy (psychotherapy). Certain types of talk therapy can be helpful in treating GAD by providing support, education, and guidance. A form of talk therapy called cognitive behavioral therapy can teach you healthy ways to think about and react to daily life events and activities.  Stress managementtechniques. These include yoga, meditation, and exercise and can be very helpful when they are practiced regularly. A mental health specialist can help determine which treatment is best for you. Some people see improvement with one therapy. However, other people require a combination of therapies. Document Released: 03/18/2013 Document Revised: 04/07/2014 Document Reviewed: 03/18/2013 ExitCare Patient Information 2015 [employer]. This information is not intended to replace advice given to you by your health care provider. Make sure you discuss any questions you have with your health care provider.  

## 2015-03-05 LAB — BASIC METABOLIC PANEL
BUN: 9 mg/dL (ref 6–23)
CALCIUM: 10.1 mg/dL (ref 8.4–10.5)
CO2: 24 meq/L (ref 19–32)
Chloride: 107 mEq/L (ref 96–112)
Creatinine, Ser: 0.9 mg/dL (ref 0.40–1.20)
GFR: 68.8 mL/min (ref >60.00)
GLUCOSE: 84 mg/dL (ref 70–99)
Potassium: 3.9 mEq/L (ref 3.5–5.1)
Sodium: 141 mEq/L (ref 135–145)

## 2015-03-05 LAB — LIPID PANEL
CHOL/HDL RATIO: 2
Cholesterol: 241 mg/dL — ABNORMAL HIGH (ref 0–200)
HDL: 98.9 mg/dL (ref >39.00)
LDL CALC: 118 mg/dL — AB (ref 0–99)
NONHDL: 142.1
Triglycerides: 120 mg/dL (ref 0.0–149.0)
VLDL: 24 mg/dL (ref 0.0–40.0)

## 2015-03-06 ENCOUNTER — Telehealth: Payer: Self-pay

## 2015-03-06 NOTE — Telephone Encounter (Signed)
Called pt to discuss form completion

## 2015-03-09 NOTE — Telephone Encounter (Signed)
LEFT PT ANOTHER MESSAGE.  Results mailed to pt

## 2015-04-17 ENCOUNTER — Telehealth: Payer: Self-pay

## 2015-04-17 NOTE — Telephone Encounter (Signed)
Left message for pt to call back concerning need for mammogram screening. 

## 2015-05-04 ENCOUNTER — Other Ambulatory Visit: Payer: Self-pay | Admitting: Family

## 2015-06-29 ENCOUNTER — Telehealth: Payer: Self-pay | Admitting: Family

## 2015-06-29 NOTE — Telephone Encounter (Signed)
Pt is requesting to transfer care to the [facility] office. Please advise if this will be okay.

## 2015-07-03 NOTE — Telephone Encounter (Signed)
Ok with me 

## 2015-07-06 DIAGNOSIS — M5020 Other cervical disc displacement, unspecified cervical region: Secondary | ICD-10-CM

## 2015-07-06 HISTORY — DX: Other cervical disc displacement, unspecified cervical region: M50.20

## 2015-07-08 ENCOUNTER — Telehealth: Payer: Self-pay | Admitting: Family

## 2015-07-08 NOTE — Telephone Encounter (Signed)
Left msg to call office to setup new patient appt/msn

## 2015-07-16 ENCOUNTER — Other Ambulatory Visit: Payer: Self-pay | Admitting: Orthopedic Surgery

## 2015-07-16 DIAGNOSIS — M5116 Intervertebral disc disorders with radiculopathy, lumbar region: Secondary | ICD-10-CM

## 2015-07-23 ENCOUNTER — Ambulatory Visit: Payer: Self-pay | Admitting: Nurse Practitioner

## 2015-07-24 ENCOUNTER — Ambulatory Visit
Admission: RE | Admit: 2015-07-24 | Discharge: 2015-07-24 | Disposition: A | Discharge status: Home / Self Care | Payer: BLUE CROSS/BLUE SHIELD | Source: Ambulatory Visit | Attending: Orthopedic Surgery | Admitting: Orthopedic Surgery

## 2015-07-24 DIAGNOSIS — M545 Low back pain: Secondary | ICD-10-CM | POA: Diagnosis present

## 2015-07-24 DIAGNOSIS — M79605 Pain in left leg: Secondary | ICD-10-CM | POA: Diagnosis present

## 2015-07-24 DIAGNOSIS — M5116 Intervertebral disc disorders with radiculopathy, lumbar region: Secondary | ICD-10-CM

## 2015-08-18 ENCOUNTER — Encounter: Payer: Self-pay | Admitting: Nurse Practitioner

## 2015-08-18 ENCOUNTER — Encounter (INDEPENDENT_AMBULATORY_CARE_PROVIDER_SITE_OTHER): Payer: Self-pay

## 2015-08-18 ENCOUNTER — Ambulatory Visit (INDEPENDENT_AMBULATORY_CARE_PROVIDER_SITE_OTHER): Payer: BLUE CROSS/BLUE SHIELD | Admitting: Nurse Practitioner

## 2015-08-18 VITALS — BP 115/84 | HR 96 | Temp 98.6°F | Resp 16 | Ht 63.0 in | Wt 139.8 lb

## 2015-08-18 DIAGNOSIS — Z1239 Encounter for other screening for malignant neoplasm of breast: Secondary | ICD-10-CM

## 2015-08-18 DIAGNOSIS — M5442 Lumbago with sciatica, left side: Secondary | ICD-10-CM | POA: Diagnosis not present

## 2015-08-18 DIAGNOSIS — M545 Low back pain, unspecified: Secondary | ICD-10-CM | POA: Insufficient documentation

## 2015-08-18 DIAGNOSIS — I10 Essential (primary) hypertension: Secondary | ICD-10-CM | POA: Diagnosis not present

## 2015-08-18 DIAGNOSIS — F411 Generalized anxiety disorder: Secondary | ICD-10-CM

## 2015-08-18 DIAGNOSIS — R351 Nocturia: Secondary | ICD-10-CM

## 2015-08-18 MED ORDER — DIAZEPAM 5 MG PO TABS: 5.0000 mg | ORAL_TABLET | Freq: Two times a day (BID) | ORAL | Status: DC | PRN

## 2015-08-18 MED ORDER — BUSPIRONE HCL 5 MG PO TABS: 5.0000 mg | ORAL_TABLET | Freq: Three times a day (TID) | ORAL | Status: DC

## 2015-08-18 MED ORDER — TRAMADOL HCL 50 MG PO TABS: 50.0000 mg | ORAL_TABLET | Freq: Three times a day (TID) | ORAL | Status: DC | PRN

## 2015-08-18 NOTE — Patient Instructions (Signed)
Welcome to [facility]! Nice to meet you.   Follow up in 3 months.   Remember to try cutting down on fluids after 6 pm.

## 2015-08-18 NOTE — Assessment & Plan Note (Addendum)
Prescription for Valium was printed and pt decided not to sign the controlled substance contract and perform the UDS today due to getting pain medication for her back pain through [facility]. She will continue to use [facility] for her controlled substances. Script was voided and shredded.   Will try Buspar

## 2015-08-18 NOTE — Assessment & Plan Note (Addendum)
Prescription for Tramadol was printed and pt decided not to sign the controlled substance contract and perform the UDS today due to getting pain medication for her back pain through [facility]. She will continue to use [facility] for her controlled substances. Script was voided and shredded.   Dr. Reita May performed her ESI- working well Dr. Leonette Monarch seeing for back pain

## 2015-08-18 NOTE — Progress Notes (Signed)
Patient ID: Lori Molina, female    DOB: [birth year]  Age: 54 y.o. MRN: [id number]  CC: Establish Care   HPI Lori Molina presents for transferring care from P. Hyman Hopes, FNP.   1) New pt info:   Immunizations- unknown tdap; flu vaccine refuses   Mammogram- Needs  Pap- WNL 2015   Colonoscopy- 5-6 yrs ago with EGD at [facility]   3) Acute Problems-  Nocturia- HCTZ? Drinking water all night (keeps a glass beside her bed due to thirst)  Twice at least at night then trouble getting back to sleep   Tramadol and Valium-   1/2 valium yesterday  Over 1 month tramadol  Had 1 script filled in August for Norco by Dr. 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 Pt reports she is not taking them anymore due to "feeling funny" and the injection by Dr. Reita May at [facility] was very helpful. Requesting Tramadol for breakthrough pain.   History Lori Molina has a past medical history of Depression; Allergy; Hyperlipidemia; Hypertension; Frequent headaches; Chicken pox; and Herniated disc, cervical (07/2015).   She has past surgical history that includes Appendectomy and Dilation and curettage of uterus.   Her family history includes Cancer in her father; Hypertension in her father.She reports that she has been smoking Cigarettes.  She has been smoking about 0.50 packs per day. She has never used smokeless tobacco. She reports that she drinks alcohol. She reports that she does not use illicit drugs.  Outpatient Prescriptions Prior to Visit  Medication Sig Dispense Refill  . lisinopril-hydrochlorothiazide (PRINZIDE,ZESTORETIC) 20-25 MG per tablet Take 1 tablet by mouth daily. 30 tablet 3  . cetirizine (ZYRTEC) 10 MG tablet Take 10 mg by mouth daily.    . diazepam (VALIUM) 5 MG tablet Take 1 tablet (5 mg total) by mouth every 12 (twelve) hours as needed. for anxiety 60 tablet 0  . traMADol (ULTRAM) 50 MG tablet Take 1 tablet (50 mg total) by mouth every 8 (eight) hours as needed for pain. 30 tablet 0  . atorvastatin  (LIPITOR) 40 MG tablet Take 40 mg by mouth daily.    Marland Kitchen buPROPion (WELLBUTRIN XL) 150 MG 24 hr tablet TAKE 1 TABLET (150 MG TOTAL) BY MOUTH DAILY. (Patient not taking: Reported on 08/18/2015) 30 tablet 1   No facility-administered medications prior to visit.    ROS Review of Systems  Constitutional: Negative for fever, chills, diaphoresis and fatigue.  Respiratory: Negative for chest tightness, shortness of breath and wheezing.   Cardiovascular: Negative for chest pain, palpitations and leg swelling.  Gastrointestinal: Negative for nausea, vomiting and diarrhea.  Genitourinary: Positive for frequency.  Musculoskeletal: Positive for back pain.  Skin: Negative for rash.  Neurological: Negative for dizziness, weakness, numbness and headaches.  Psychiatric/Behavioral: The patient is nervous/anxious.    Objective:  BP 115/84 mmHg  Pulse 96  Temp(Src) 98.6 F (37 C)  Resp 16  Ht  (1.6 m)  Wt 139 lb 12.8 oz (63.413 kg)  BMI 24.77 kg/m2  SpO2 96%  Physical Exam  Constitutional: She is oriented to person, place, and time. She appears well-developed and well-nourished. No distress.  HENT:  Head: Normocephalic and atraumatic.  Right Ear: External ear normal.  Left Ear: External ear normal.  Cardiovascular: Normal rate, regular rhythm and normal heart sounds.  Exam reveals no gallop and no friction rub.   No murmur heard. Pulmonary/Chest: Effort normal and breath sounds normal. No respiratory distress. She has no wheezes. She has no rales. She  exhibits no tenderness.  Neurological: She is alert and oriented to person, place, and time. No cranial nerve deficit. She exhibits normal muscle tone. Coordination normal.  Skin: Skin is warm and dry. No rash noted. She is not diaphoretic.  Psychiatric: She has a normal mood and affect. Her behavior is normal. Judgment and thought content normal.   Assessment & Plan:   [name] was seen today for establish care.  Diagnoses and all orders for  this visit:  Generalized anxiety disorder  Left-sided low back pain with left-sided sciatica  Screening for breast cancer -     MM Digital Screening; Future  Benign essential HTN  Nocturia  Other orders -     traMADol (ULTRAM) 50 MG tablet; Take 1 tablet (50 mg total) by mouth every 8 (eight) hours as needed. -     diazepam (VALIUM) 5 MG tablet; Take 1 tablet (5 mg total) by mouth every 12 (twelve) hours as needed. for anxiety -     busPIRone (BUSPAR) 5 MG tablet; Take 1 tablet (5 mg total) by mouth 3 (three) times daily.  I have discontinued Lori Molina's atorvastatin, cetirizine, and buPROPion. I have also changed her traMADol. Additionally, I am having her start on busPIRone. Lastly, I am having her maintain her lisinopril-hydrochlorothiazide and diazepam.  Meds ordered this encounter  Medications  . traMADol (ULTRAM) 50 MG tablet    Sig: Take 1 tablet (50 mg total) by mouth every 8 (eight) hours as needed.    Dispense:  30 tablet    Refill:  0    Order Specific Question:  Supervising Provider    Answer:  Duncan Dull L [[id number]]  . diazepam (VALIUM) 5 MG tablet    Sig: Take 1 tablet (5 mg total) by mouth every 12 (twelve) hours as needed. for anxiety    Dispense:  60 tablet    Refill:  0    This request is for a new prescription for a controlled substance as required by Federal/State law.    Order Specific Question:  Supervising Provider    Answer:  Sherlene Shams [[id number]]  . busPIRone (BUSPAR) 5 MG tablet    Sig: Take 1 tablet (5 mg total) by mouth 3 (three) times daily.    Dispense:  90 tablet    Refill:  0    Order Specific Question:  Supervising Provider    Answer:  Sherlene Shams [[id number]]     Follow-up: Return in about 3 months (around 11/17/2015) for Back pain/Anxiety/Nocturia.

## 2015-08-18 NOTE — Progress Notes (Signed)
Pre visit review using our clinic review tool, if applicable. No additional management support is needed unless otherwise documented below in the visit note. 

## 2015-08-28 DIAGNOSIS — R351 Nocturia: Secondary | ICD-10-CM | POA: Insufficient documentation

## 2015-08-28 NOTE — Assessment & Plan Note (Signed)
Pt drinking water all night. Asked her to stop fluids at dinner and follow up.

## 2015-08-28 NOTE — Assessment & Plan Note (Signed)
BP Readings from Last 3 Encounters:  08/18/15 115/84  03/04/15 120/90  04/30/14 100/60   Will continue with current regimen.

## 2015-09-13 ENCOUNTER — Other Ambulatory Visit: Payer: Self-pay | Admitting: Nurse Practitioner

## 2015-09-30 ENCOUNTER — Ambulatory Visit (INDEPENDENT_AMBULATORY_CARE_PROVIDER_SITE_OTHER): Payer: BLUE CROSS/BLUE SHIELD | Admitting: Nurse Practitioner

## 2015-09-30 ENCOUNTER — Encounter: Payer: Self-pay | Admitting: Nurse Practitioner

## 2015-09-30 VITALS — BP 112/80 | HR 102 | Temp 98.4°F | Resp 14 | Ht 63.0 in | Wt 136.0 lb

## 2015-09-30 DIAGNOSIS — F32A Depression, unspecified: Secondary | ICD-10-CM

## 2015-09-30 DIAGNOSIS — F329 Major depressive disorder, single episode, unspecified: Secondary | ICD-10-CM | POA: Diagnosis not present

## 2015-09-30 MED ORDER — BUSPIRONE HCL 5 MG PO TABS: ORAL_TABLET | ORAL | Status: DC

## 2015-09-30 MED ORDER — SERTRALINE HCL 50 MG PO TABS: 50.0000 mg | ORAL_TABLET | Freq: Every day | ORAL | Status: DC

## 2015-09-30 NOTE — Progress Notes (Signed)
Patient ID: Lori Molina, female    DOB: 1961-10-29  Age: 54 y.o. MRN: [id number]  CC: Follow-up   HPI Lori Molina presents for CC of depression.   1) She has recently lost her job and has signs of depression.  PHQ-2 = 6  PHQ- 9 = 17 Counselor- tomorrow in [place]. She has a contact who sees her family member and she worked her in to have a session.  She has been through depression before and feels she needs some help. She has had several stressor over the years. She reports having a great support group from church members, [facility] group, and her family.  She has tried Zoloft for 5 years in the past and found it helpful. She is using buspirone twice daily for the past month and finds it helpful.   History Lori Molina has a past medical history of Depression; Allergy; Hyperlipidemia; Hypertension; Frequent headaches; Chicken pox; and Herniated disc, cervical (07/2015).   She has past surgical history that includes Appendectomy and Dilation and curettage of uterus.   Her family history includes Cancer in her father; Hypertension in her father.She reports that she has been smoking Cigarettes.  She has been smoking about 0.50 packs per day. She has never used smokeless tobacco. She reports that she drinks alcohol. She reports that she does not use illicit drugs.  Outpatient Prescriptions Prior to Visit  Medication Sig Dispense Refill  . lisinopril-hydrochlorothiazide (PRINZIDE,ZESTORETIC) 20-25 MG per tablet Take 1 tablet by mouth daily. 30 tablet 3  . busPIRone (BUSPAR) 5 MG tablet TAKE 1 TABLET (5 MG TOTAL) BY MOUTH 3 (THREE) TIMES DAILY. 90 tablet 3  . diazepam (VALIUM) 5 MG tablet Take 1 tablet (5 mg total) by mouth every 12 (twelve) hours as needed. for anxiety (Patient not taking: Reported on 09/30/2015) 60 tablet 0  . traMADol (ULTRAM) 50 MG tablet Take 1 tablet (50 mg total) by mouth every 8 (eight) hours as needed. (Patient not taking: Reported on 09/30/2015) 30 tablet 0   No  facility-administered medications prior to visit.    ROS Review of Systems  Constitutional: Negative for fever, chills, diaphoresis and fatigue.  Respiratory: Negative for chest tightness, shortness of breath and wheezing.   Cardiovascular: Negative for chest pain, palpitations and leg swelling.  Gastrointestinal: Negative for nausea, vomiting and diarrhea.  Skin: Negative for rash.  Neurological: Negative for dizziness, weakness, numbness and headaches.  Psychiatric/Behavioral: Negative for suicidal ideas. The patient is nervous/anxious.    Objective:  BP 112/80 mmHg  Pulse 102  Temp(Src) 98.4 F (36.9 C)  Resp 14  Ht  (1.6 m)  Wt 136 lb (61.689 kg)  BMI 24.10 kg/m2  SpO2 96%  Physical Exam  Constitutional: She is oriented to person, place, and time. She appears well-developed and well-nourished. No distress.  HENT:  Head: Normocephalic and atraumatic.  Right Ear: External ear normal.  Left Ear: External ear normal.  Cardiovascular: Normal rate, regular rhythm and normal heart sounds.   Pulmonary/Chest: Effort normal and breath sounds normal. No respiratory distress. She has no wheezes. She has no rales. She exhibits no tenderness.  Neurological: She is alert and oriented to person, place, and time. No cranial nerve deficit. She exhibits normal muscle tone. Coordination normal.  Skin: Skin is warm and dry. No rash noted. She is not diaphoretic.  Psychiatric: She has a normal mood and affect. Her behavior is normal. Judgment and thought content normal.  Tearful   Assessment & Plan:   Lori Molina  was seen today for follow-up.  Diagnoses and all orders for this visit:  Depression  Other orders -     sertraline (ZOLOFT) 50 MG tablet; Take 1 tablet (50 mg total) by mouth daily. -     busPIRone (BUSPAR) 5 MG tablet; TAKE 1 TABLET (5 MG TOTAL) BY MOUTH 3 (THREE) TIMES DAILY.  I have discontinued Ms. Noell's traMADol. I am also having her start on sertraline.  Additionally, I am having her maintain her lisinopril-hydrochlorothiazide, diazepam, and busPIRone.  Meds ordered this encounter  Medications  . sertraline (ZOLOFT) 50 MG tablet    Sig: Take 1 tablet (50 mg total) by mouth daily.    Dispense:  30 tablet    Refill:  0    Order Specific Question:  Supervising Provider    Answer:  Duncan DullULLO, TERESA L [[id number]]  . busPIRone (BUSPAR) 5 MG tablet    Sig: TAKE 1 TABLET (5 MG TOTAL) BY MOUTH 3 (THREE) TIMES DAILY.    Dispense:  90 tablet    Refill:  3    Order Specific Question:  Supervising Provider    Answer:  Sherlene ShamsULLO, TERESA L [[id number]]     Follow-up: Return in about 4 weeks (around 10/28/2015).

## 2015-09-30 NOTE — Progress Notes (Signed)
Pre visit review using our clinic review tool, if applicable. No additional management support is needed unless otherwise documented below in the visit note. 

## 2015-09-30 NOTE — Patient Instructions (Addendum)
Try the 1/2 tablet of zoloft for 1 week then start on whole tablet. Follow up in 1 month.

## 2015-09-30 NOTE — Assessment & Plan Note (Addendum)
Pt recently lost job and has been feeling very stressed out recently. Patient reports she has struggled with depression in the past after family stressors and has tried Zoloft with success. She would like to try this once again. Zoloft 50 mg 1/2 tablet x 7 days then whole tablet the rest of the time. Follow up in 4 weeks. Buspar also refilled. Pt seeing a counselor in [place] tomorrow.   I have spent 25 minutes face to face > 50% of time spent on counseling about depression, treatments options, and how to proceed with medications.

## 2015-11-04 ENCOUNTER — Encounter: Payer: Self-pay | Admitting: Nurse Practitioner

## 2015-11-04 ENCOUNTER — Ambulatory Visit (INDEPENDENT_AMBULATORY_CARE_PROVIDER_SITE_OTHER): Payer: BLUE CROSS/BLUE SHIELD | Admitting: Nurse Practitioner

## 2015-11-04 VITALS — BP 120/84 | HR 93 | Temp 98.0°F | Wt 141.0 lb

## 2015-11-04 DIAGNOSIS — F32A Depression, unspecified: Secondary | ICD-10-CM

## 2015-11-04 DIAGNOSIS — F329 Major depressive disorder, single episode, unspecified: Secondary | ICD-10-CM | POA: Diagnosis not present

## 2015-11-04 DIAGNOSIS — F411 Generalized anxiety disorder: Secondary | ICD-10-CM

## 2015-11-04 MED ORDER — DIAZEPAM 5 MG PO TABS: ORAL_TABLET | ORAL | Status: DC

## 2015-11-04 MED ORDER — LISINOPRIL-HYDROCHLOROTHIAZIDE 20-25 MG PO TABS: 1.0000 | ORAL_TABLET | Freq: Every day | ORAL | Status: DC

## 2015-11-04 MED ORDER — SERTRALINE HCL 100 MG PO TABS: 100.0000 mg | ORAL_TABLET | Freq: Every day | ORAL | Status: DC

## 2015-11-04 NOTE — Patient Instructions (Addendum)
Great to see you and [place] fun in [place]!   Follow up in 2 months. Happy Holidays.

## 2015-11-04 NOTE — Progress Notes (Signed)
Pre visit review using our clinic review tool, if applicable. No additional management support is needed unless otherwise documented below in the visit note. 

## 2015-11-04 NOTE — Progress Notes (Signed)
Patient ID: Lori Molina, female    DOB: February 21, 1961  Age: 54 y.o. MRN: [id number]  CC: Follow-up   HPI Lori Molina presents for follow up of depression and anxiety.  1) PHQ-2 and PHQ-9 were positive for depressive disorder at last visit.  Counselor- Psychiatric nurseseeing Minister   Counseling [facility] went very well  Buspirone vs. Valium   Buspirone is not helpful- Valium was helpful, she took it prn. She is currently not on narcotics.  Zoloft 50 mg taking 1/2 tablet in the morning and 1/2 tablet afternoon   History Lori Molina has a past medical history of Depression; Allergy; Hyperlipidemia; Hypertension; Frequent headaches; Chicken pox; and Herniated disc, cervical (07/2015).   She has past surgical history that includes Appendectomy and Dilation and curettage of uterus.   Her family history includes Cancer in her father; Hypertension in her father.She reports that she has been smoking Cigarettes.  She has been smoking about 0.50 packs per day. She has never used smokeless tobacco. She reports that she drinks alcohol. She reports that she does not use illicit drugs.  Outpatient Prescriptions Prior to Visit  Medication Sig Dispense Refill  . busPIRone (BUSPAR) 5 MG tablet TAKE 1 TABLET (5 MG TOTAL) BY MOUTH 3 (THREE) TIMES DAILY. 90 tablet 3  . lisinopril-hydrochlorothiazide (PRINZIDE,ZESTORETIC) 20-25 MG per tablet Take 1 tablet by mouth daily. 30 tablet 3  . sertraline (ZOLOFT) 50 MG tablet Take 1 tablet (50 mg total) by mouth daily. 30 tablet 0  . diazepam (VALIUM) 5 MG tablet Take 1 tablet (5 mg total) by mouth every 12 (twelve) hours as needed. for anxiety (Patient not taking: Reported on 09/30/2015) 60 tablet 0   No facility-administered medications prior to visit.    ROS Review of Systems  Constitutional: Negative for fever, chills, diaphoresis and fatigue.  Respiratory: Negative for chest tightness, shortness of breath and wheezing.   Cardiovascular: Negative for chest pain,  palpitations and leg swelling.  Gastrointestinal: Negative for nausea, vomiting and diarrhea.  Skin: Negative for rash.  Neurological: Negative for dizziness, weakness, numbness and headaches.  Psychiatric/Behavioral: Negative for suicidal ideas and sleep disturbance. The patient is nervous/anxious.     Objective:  BP 120/84 mmHg  Pulse 93  Temp(Src) 98 F (36.7 C) (Oral)  Wt 141 lb (63.957 kg)  SpO2 97%  Physical Exam  Constitutional: She is oriented to person, place, and time. She appears well-developed and well-nourished. No distress.  HENT:  Head: Normocephalic and atraumatic.  Right Ear: External ear normal.  Left Ear: External ear normal.  Cardiovascular: Normal rate, regular rhythm and normal heart sounds.  Exam reveals no gallop and no friction rub.   No murmur heard. Pulmonary/Chest: Effort normal and breath sounds normal. No respiratory distress. She has no wheezes. She has no rales. She exhibits no tenderness.  Neurological: She is alert and oriented to person, place, and time. No cranial nerve deficit. She exhibits normal muscle tone. Coordination normal.  Skin: Skin is warm and dry. No rash noted. She is not diaphoretic.  Psychiatric: She has a normal mood and affect. Her behavior is normal. Judgment and thought content normal.  Less tearful today, affect does seem flatter than previously   Assessment & Plan:   Lori Molina was seen today for follow-up.  Diagnoses and all orders for this visit:  Depression  Generalized anxiety disorder  Other orders -     sertraline (ZOLOFT) 100 MG tablet; Take 1 tablet (100 mg total) by mouth daily. -  lisinopril-hydrochlorothiazide (PRINZIDE,ZESTORETIC) 20-25 MG tablet; Take 1 tablet by mouth daily. -     diazepam (VALIUM) 5 MG tablet; Take 1 tablet by mouth as needed for anxiety   I have discontinued Lori Molina's sertraline and busPIRone. I have also changed her lisinopril-hydrochlorothiazide and diazepam. Additionally, I  am having her start on sertraline.  Meds ordered this encounter  Medications  . sertraline (ZOLOFT) 100 MG tablet    Sig: Take 1 tablet (100 mg total) by mouth daily.    Dispense:  30 tablet    Refill:  1    Order Specific Question:  Supervising Provider    Answer:  Duncan Dull L [[id number]]  . lisinopril-hydrochlorothiazide (PRINZIDE,ZESTORETIC) 20-25 MG tablet    Sig: Take 1 tablet by mouth daily.    Dispense:  30 tablet    Refill:  5    Order Specific Question:  Supervising Provider    Answer:  Duncan Dull L [[id number]]  . diazepam (VALIUM) 5 MG tablet    Sig: Take 1 tablet by mouth as needed for anxiety    Dispense:  30 tablet    Refill:  0    Order Specific Question:  Supervising Provider    Answer:  Sherlene Shams [[id number]]     Follow-up: Return in about 2 months (around 01/04/2016) for Medication .

## 2015-11-08 NOTE — Assessment & Plan Note (Signed)
Stopped buspar and switched to Valium. Discussed risks, benefits, and concerns about con-current use with other CNS depressants, which she is not on currently. CSC and UDS obtained today and NCCSRS checked for compliance. Will follow

## 2015-11-08 NOTE — Assessment & Plan Note (Addendum)
Still feeling sad and stressed. It happened after she was leaving time with her parents and sister. She began to feel dread and anxiety when coming back to her daily life. She had great experiences with counseling and is seeing her minister this afternoon for further counseling. She was denied a job after a promising interview, which further depressed her. Upped Zoloft, encouraged counseling, and started valium. CSC, UDS, and NCCSRS obtained and checked for compliance. FU in 2 months as [place] will [place] in [place] with her family.

## 2015-12-01 ENCOUNTER — Encounter: Payer: Self-pay | Admitting: Nurse Practitioner

## 2015-12-09 ENCOUNTER — Ambulatory Visit
Admission: RE | Admit: 2015-12-09 | Discharge: 2015-12-09 | Disposition: A | Discharge status: Home / Self Care | Payer: BLUE CROSS/BLUE SHIELD | Source: Ambulatory Visit | Attending: Nurse Practitioner | Admitting: Nurse Practitioner

## 2015-12-09 ENCOUNTER — Ambulatory Visit (INDEPENDENT_AMBULATORY_CARE_PROVIDER_SITE_OTHER): Payer: BLUE CROSS/BLUE SHIELD | Admitting: Nurse Practitioner

## 2015-12-09 VITALS — BP 128/72 | HR 97 | Ht 63.0 in

## 2015-12-09 DIAGNOSIS — S9001XA Contusion of right ankle, initial encounter: Secondary | ICD-10-CM

## 2015-12-09 DIAGNOSIS — M79671 Pain in right foot: Secondary | ICD-10-CM

## 2015-12-09 MED ORDER — HYDROCODONE-ACETAMINOPHEN 5-325 MG PO TABS: 1.0000 | ORAL_TABLET | Freq: Four times a day (QID) | ORAL | Status: DC | PRN

## 2015-12-09 NOTE — Patient Instructions (Signed)
Ankle Sprain  An ankle sprain is an injury to the strong, fibrous tissues (ligaments) that hold the bones of your ankle joint together.   CAUSES  An ankle sprain is usually caused by a fall or by twisting your ankle. Ankle sprains most commonly occur when you step on the outer edge of your foot, and your ankle turns inward. People who participate in sports are more prone to these types of injuries.   SYMPTOMS    Pain in your ankle. The pain may be present at rest or only when you are trying to stand or walk.   Swelling.   Bruising. Bruising may develop immediately or within 1 to 2 days after your injury.   Difficulty standing or walking, particularly when turning corners or changing directions.  DIAGNOSIS   Your caregiver will ask you details about your injury and perform a physical exam of your ankle to determine if you have an ankle sprain. During the physical exam, your caregiver will press on and apply pressure to specific areas of your foot and ankle. Your caregiver will try to move your ankle in certain ways. An X-ray exam may be done to be sure a bone was not broken or a ligament did not separate from one of the bones in your ankle (avulsion fracture).   TREATMENT   Certain types of braces can help stabilize your ankle. Your caregiver can make a recommendation for this. Your caregiver may recommend the use of medicine for pain. If your sprain is severe, your caregiver may refer you to a surgeon who helps to restore function to parts of your skeletal system (orthopedist) or a physical therapist.  HOME CARE INSTRUCTIONS    Apply ice to your injury for 1-2 days or as directed by your caregiver. Applying ice helps to reduce inflammation and pain.    Put ice in a plastic bag.    Place a towel between your skin and the bag.    Leave the ice on for 15-20 minutes at a time, every 2 hours while you are awake.   Only take over-the-counter or prescription medicines for pain, discomfort, or fever as directed by  your caregiver.   Elevate your injured ankle above the level of your heart as much as possible for 2-3 days.   If your caregiver recommends crutches, use them as instructed. Gradually put weight on the affected ankle. Continue to use crutches or a cane until you can walk without feeling pain in your ankle.   If you have a plaster splint, wear the splint as directed by your caregiver. Do not rest it on anything harder than a pillow for the first 24 hours. Do not put weight on it. Do not get it wet. You may take it off to take a shower or bath.   You may have been given an elastic bandage to wear around your ankle to provide support. If the elastic bandage is too tight (you have numbness or tingling in your foot or your foot becomes cold and blue), adjust the bandage to make it comfortable.   If you have an air splint, you may blow more air into it or let air out to make it more comfortable. You may take your splint off at night and before taking a shower or bath. Wiggle your toes in the splint several times per day to decrease swelling.  SEEK MEDICAL CARE IF:    You have rapidly increasing bruising or swelling.   Your toes feel   extremely cold or you lose feeling in your foot.   Your pain is not relieved with medicine.  SEEK IMMEDIATE MEDICAL CARE IF:   Your toes are numb or blue.   You have severe pain that is increasing.  MAKE SURE YOU:    Understand these instructions.   Will watch your condition.   Will get help right away if you are not doing well or get worse.     This information is not intended to replace advice given to you by your health care provider. Make sure you discuss any questions you have with your health care provider.     Document Released: 11/21/2005 Document Revised: 12/12/2014 Document Reviewed: 12/03/2011  Elsevier Interactive Patient Education 2016 [employer]

## 2015-12-10 ENCOUNTER — Telehealth: Payer: Self-pay

## 2015-12-10 NOTE — Telephone Encounter (Signed)
Results called to patient ( x ray of foot and ankle) along with Lori Molina's result note.  Patient understands and without further questions.  Instructed patient to call back if there is no improvement after several weeks or sooner if problems, questions or concerns.

## 2015-12-14 ENCOUNTER — Other Ambulatory Visit: Payer: Self-pay | Admitting: Nurse Practitioner

## 2015-12-14 ENCOUNTER — Encounter: Payer: Self-pay | Admitting: Nurse Practitioner

## 2015-12-14 DIAGNOSIS — S9000XA Contusion of unspecified ankle, initial encounter: Secondary | ICD-10-CM | POA: Insufficient documentation

## 2015-12-14 DIAGNOSIS — M79671 Pain in right foot: Secondary | ICD-10-CM | POA: Insufficient documentation

## 2015-12-14 NOTE — Progress Notes (Signed)
Patient ID: Lori Molina, female    DOB: 07-02-1961  Age: 55 y.o. MRN: [id number]  CC: Ankle Injury   HPI Lori Molina presents for ankle injury on right 4 days  1) patient reports she was in [place] and was probable boarding when she fell off and the water on New Year's Day She reportedly njured her right ankle and had swelling and ecchymosis She was bathing yesterday and realized the bruising had extended to the bottom of her foot She was looked at by her relative who is a PA and thought it was not broken, she advised her to follow up once back home Pain 8 out of 10 RICE at home, in wheelchair at office today  History Lori Molina has a past medical history of Depression; Allergy; Hyperlipidemia; Hypertension; Frequent headaches; Chicken pox; and Herniated disc, cervical (07/2015).   She has past surgical history that includes Appendectomy and Dilation and curettage of uterus.   Her family history includes Cancer in her father; Hypertension in her father.She reports that she has been smoking Cigarettes.  She has been smoking about 0.50 packs per day. She has never used smokeless tobacco. She reports that she drinks alcohol. She reports that she does not use illicit drugs.  Outpatient Prescriptions Prior to Visit  Medication Sig Dispense Refill  . diazepam (VALIUM) 5 MG tablet Take 1 tablet by mouth as needed for anxiety 30 tablet 0  . lisinopril-hydrochlorothiazide (PRINZIDE,ZESTORETIC) 20-25 MG tablet Take 1 tablet by mouth daily. 30 tablet 5  . sertraline (ZOLOFT) 100 MG tablet Take 1 tablet (100 mg total) by mouth daily. 30 tablet 1   No facility-administered medications prior to visit.    ROS Review of Systems  Constitutional: Negative for fever, chills, diaphoresis and fatigue.  Respiratory: Negative for chest tightness, shortness of breath and wheezing.   Cardiovascular: Negative for chest pain, palpitations and leg swelling.  Gastrointestinal: Negative for nausea,  vomiting and diarrhea.  Musculoskeletal: Positive for myalgias, joint swelling, arthralgias and gait problem.  Skin: Positive for color change. Negative for rash and wound.       Ecchymosis right ankle/foot  Neurological: Negative for dizziness, weakness, numbness and headaches.  Psychiatric/Behavioral: The patient is not nervous/anxious.     Objective:  BP 128/72 mmHg  Pulse 97  Ht 5\' 3"  (1.6 m)  SpO2 98%  Physical Exam  Constitutional: She is oriented to person, place, and time. She appears well-developed and well-nourished. No distress.  HENT:  Head: Normocephalic and atraumatic.  Right Ear: External ear normal.  Left Ear: External ear normal.  Musculoskeletal: She exhibits edema and tenderness.       Feet:  Major areas of bruising and swelling noted with red marks on the drawing  Neurological: She is alert and oriented to person, place, and time. She displays normal reflexes. No cranial nerve deficit. She exhibits normal muscle tone. Coordination abnormal.  Skin: Skin is warm and dry. No rash noted. She is not diaphoretic.  Psychiatric: She has a normal mood and affect. Her behavior is normal. Judgment and thought content normal.      Assessment & Plan:   Lori Molina was seen today for ankle injury.  Diagnoses and all orders for this visit:  Ankle bruise, right, initial encounter -     DG Foot Complete Right; Future -     DG Ankle Complete Right; Future  Pain in right foot -     DG Foot Complete Right; Future -     DG  Ankle Complete Right; Future  Other orders -     HYDROcodone-acetaminophen (NORCO) 5-325 MG tablet; Take 1 tablet by mouth every 6 (six) hours as needed for moderate pain.   I am having Lori Molina start on HYDROcodone-acetaminophen. I am also having her maintain her sertraline, lisinopril-hydrochlorothiazide, and diazepam.  Meds ordered this encounter  Medications  . HYDROcodone-acetaminophen (NORCO) 5-325 MG tablet    Sig: Take 1 tablet by mouth  every 6 (six) hours as needed for moderate pain.    Dispense:  15 tablet    Refill:  0    Order Specific Question:  Supervising Provider    Answer:  Sherlene Shams [[id number]]     Follow-up: Return if symptoms worsen or fail to improve.

## 2015-12-14 NOTE — Assessment & Plan Note (Signed)
[place] Pt was given pair of crutches  She was given wrapping supplies- coban and kerlix (all we had) to wrap after x-rays.  Pt was practicing with crutches in office and we made sure to fit them to her height Norco given for pain  [facility].  FU after results via phone

## 2015-12-14 NOTE — Assessment & Plan Note (Signed)
New Onset Pt was given pair of crutches  She was given wrapping supplies- coban and kerlix (all we had) to wrap after x-rays.  Pt was practicing with crutches in office and we made sure to fit them to her height Norco given for pain  [facility] at [facility] ordered.  FU after results via phone

## 2015-12-15 ENCOUNTER — Other Ambulatory Visit: Payer: Self-pay | Admitting: Nurse Practitioner

## 2015-12-15 MED ORDER — HYDROCODONE-ACETAMINOPHEN 5-325 MG PO TABS: 1.0000 | ORAL_TABLET | Freq: Four times a day (QID) | ORAL | Status: DC | PRN

## 2015-12-15 NOTE — Telephone Encounter (Signed)
Please advise refill?  Patient stated, "  Lori Molina,   I am still having pain around my ankle bones. I have stayed off my foot and iced as instructed. The swelling as gone down a lot and it is really black and blue.

## 2015-12-15 NOTE — Telephone Encounter (Signed)
I sent her a message back this morning stating I will refill this and it was printed and placed for pick up at the front desk. She will continue to have bruising and swelling and pain for a few weeks.Thanks!

## 2015-12-22 ENCOUNTER — Other Ambulatory Visit: Payer: Self-pay | Admitting: Nurse Practitioner

## 2015-12-22 NOTE — Telephone Encounter (Signed)
Both refill and last office visit was in November. Please advise?

## 2015-12-23 NOTE — Telephone Encounter (Signed)
rx faxed to pharmacy

## 2015-12-23 NOTE — Telephone Encounter (Signed)
Rx printed and signed by PCP. Called pharmacy to confirm fax number as the fax came back as incorrect.  Called pt but number has been disconnected. Number has been deleted from demographics.   My chart message sent.

## 2015-12-28 ENCOUNTER — Encounter: Payer: Self-pay | Admitting: Nurse Practitioner

## 2015-12-28 ENCOUNTER — Other Ambulatory Visit: Payer: Self-pay | Admitting: Nurse Practitioner

## 2015-12-29 ENCOUNTER — Other Ambulatory Visit: Payer: Self-pay | Admitting: Nurse Practitioner

## 2015-12-29 NOTE — Telephone Encounter (Signed)
You refilled this on 12/15/15. Please advise?

## 2015-12-29 NOTE — Telephone Encounter (Signed)
error 

## 2015-12-30 ENCOUNTER — Encounter: Payer: Self-pay | Admitting: Nurse Practitioner

## 2015-12-31 ENCOUNTER — Other Ambulatory Visit: Payer: Self-pay | Admitting: Nurse Practitioner

## 2015-12-31 MED ORDER — HYDROCODONE-ACETAMINOPHEN 5-325 MG PO TABS: 1.0000 | ORAL_TABLET | Freq: Four times a day (QID) | ORAL | Status: DC | PRN

## 2016-01-05 ENCOUNTER — Ambulatory Visit: Payer: Self-pay | Admitting: Nurse Practitioner

## 2016-01-07 ENCOUNTER — Encounter: Payer: Self-pay | Admitting: Nurse Practitioner

## 2016-01-07 ENCOUNTER — Ambulatory Visit (INDEPENDENT_AMBULATORY_CARE_PROVIDER_SITE_OTHER): Payer: BLUE CROSS/BLUE SHIELD | Admitting: Nurse Practitioner

## 2016-01-07 VITALS — BP 104/70 | HR 93 | Temp 98.2°F | Wt 137.5 lb

## 2016-01-07 DIAGNOSIS — F329 Major depressive disorder, single episode, unspecified: Secondary | ICD-10-CM | POA: Diagnosis not present

## 2016-01-07 DIAGNOSIS — M79671 Pain in right foot: Secondary | ICD-10-CM

## 2016-01-07 DIAGNOSIS — F411 Generalized anxiety disorder: Secondary | ICD-10-CM | POA: Diagnosis not present

## 2016-01-07 DIAGNOSIS — F32A Depression, unspecified: Secondary | ICD-10-CM

## 2016-01-07 MED ORDER — SERTRALINE HCL 100 MG PO TABS: 100.0000 mg | ORAL_TABLET | Freq: Every day | ORAL | Status: DC

## 2016-01-07 MED ORDER — OMEPRAZOLE 20 MG PO CPDR: 20.0000 mg | DELAYED_RELEASE_CAPSULE | Freq: Every day | ORAL | Status: DC

## 2016-01-07 MED ORDER — HYDROCODONE-ACETAMINOPHEN 5-325 MG PO TABS: 1.0000 | ORAL_TABLET | Freq: Four times a day (QID) | ORAL | Status: DC | PRN

## 2016-01-07 NOTE — Progress Notes (Signed)
Pre visit review using our clinic review tool, if applicable. No additional management support is needed unless otherwise documented below in the visit note. 

## 2016-01-07 NOTE — Patient Instructions (Addendum)
Follow up in 3 months.   We will call with referral to podiatry.

## 2016-01-07 NOTE — Progress Notes (Signed)
Patient ID: Lori Molina, female    DOB: [birth year]  Age: 55 y.o. MRN: [id number]  CC: 2 mos FU   HPI Lori Molina presents for 2 month follow up.   1) Right ankle- still painful under ankle and under heel  Ecchymosis resolved Norco- 1-2 a day, takes the edge off  Valium- taking rarely and using 1/2 tablet   Prilosec- OTC helpful   2) BP- 1/2 tablet daily   Going to be in [place] for a week starting next Friday for a conference. Will need refill of Noro.   History Lori Molina has a past medical history of Depression; Allergy; Hyperlipidemia; Hypertension; Frequent headaches; Chicken pox; and Herniated disc, cervical (07/2015).   She has past surgical history that includes Appendectomy and Dilation and curettage of uterus.   Her family history includes Cancer in her father; Hypertension in her father.She reports that she has been smoking Cigarettes.  She has been smoking about 0.50 packs per day. She has never used smokeless tobacco. She reports that she drinks alcohol. She reports that she does not use illicit drugs.  Outpatient Prescriptions Prior to Visit  Medication Sig Dispense Refill  . diazepam (VALIUM) 5 MG tablet TAKE 1 TABLET BY MOUTH AS NEEDED FOR ANXIETY 30 tablet 0  . lisinopril-hydrochlorothiazide (PRINZIDE,ZESTORETIC) 20-25 MG tablet Take 1 tablet by mouth daily. 30 tablet 5  . HYDROcodone-acetaminophen (NORCO) 5-325 MG tablet Take 1 tablet by mouth every 6 (six) hours as needed for moderate pain. 30 tablet 0  . sertraline (ZOLOFT) 100 MG tablet Take 1 tablet (100 mg total) by mouth daily. 30 tablet 1   No facility-administered medications prior to visit.    ROS Review of Systems  Constitutional: Negative for fever, chills, diaphoresis and fatigue.  Respiratory: Negative for chest tightness, shortness of breath and wheezing.   Cardiovascular: Negative for chest pain, palpitations and leg swelling.  Gastrointestinal: Negative for nausea, vomiting and  diarrhea.  Musculoskeletal: Positive for myalgias, joint swelling, arthralgias and gait problem. Negative for back pain, neck pain and neck stiffness.       Related to right ankle injury  Skin: Negative for rash.  Neurological: Negative for dizziness, weakness, numbness and headaches.  Psychiatric/Behavioral: The patient is not nervous/anxious.     Objective:  BP 104/70 mmHg  Pulse 93  Temp(Src) 98.2 F (36.8 C) (Oral)  Wt 137 lb 8 oz (62.37 kg)  SpO2 98%  Physical Exam  Constitutional: She is oriented to person, place, and time. She appears well-developed and well-nourished. No distress.  HENT:  Head: Normocephalic and atraumatic.  Right Ear: External ear normal.  Left Ear: External ear normal.  Cardiovascular: Normal rate, regular rhythm and normal heart sounds.  Exam reveals no gallop and no friction rub.   No murmur heard. Pulmonary/Chest: Effort normal and breath sounds normal. No respiratory distress. She has no wheezes. She has no rales. She exhibits no tenderness.  Musculoskeletal: Normal range of motion. She exhibits edema and tenderness.  Edema and tenderness to the inferior right malleolus laterally  Ecchymosis has resolved around lateral malleolus and plantar surface of right foot  Neurological: She is alert and oriented to person, place, and time. No cranial nerve deficit. She exhibits normal muscle tone. Coordination normal.  Skin: Skin is warm and dry. No rash noted. She is not diaphoretic.  Psychiatric: She has a normal mood and affect. Her behavior is normal. Judgment and thought content normal.   Assessment & Plan:   Lori Molina was seen today  for 2 mos fu.  Diagnoses and all orders for this visit:  Pain in right foot -     Ambulatory referral to Podiatry  Generalized anxiety disorder  Depression  Other orders -     omeprazole (PRILOSEC) 20 MG capsule; Take 1 capsule (20 mg total) by mouth daily. -     sertraline (ZOLOFT) 100 MG tablet; Take 1 tablet (100  mg total) by mouth daily. -     HYDROcodone-acetaminophen (NORCO) 5-325 MG tablet; Take 1 tablet by mouth every 6 (six) hours as needed for moderate pain.   I am having Lori Molina start on omeprazole. I am also having her maintain her lisinopril-hydrochlorothiazide, diazepam, sertraline, and HYDROcodone-acetaminophen.  Meds ordered this encounter  Medications  . omeprazole (PRILOSEC) 20 MG capsule    Sig: Take 1 capsule (20 mg total) by mouth daily.    Dispense:  30 capsule    Refill:  3    Order Specific Question:  Supervising Provider    Answer:  Duncan Dull L [[id number]]  . sertraline (ZOLOFT) 100 MG tablet    Sig: Take 1 tablet (100 mg total) by mouth daily.    Dispense:  30 tablet    Refill:  2    Order Specific Question:  Supervising Provider    Answer:  Duncan Dull L [[id number]]  . HYDROcodone-acetaminophen (NORCO) 5-325 MG tablet    Sig: Take 1 tablet by mouth every 6 (six) hours as needed for moderate pain.    Dispense:  30 tablet    Refill:  0    Fill on or after February 10th, 2017    Order Specific Question:  Supervising Provider    Answer:  Sherlene Shams [[id number]]     Follow-up: Return in about 3 months (around 04/05/2016) for Follow up.

## 2016-01-12 NOTE — Assessment & Plan Note (Signed)
Still having a lot of pain and using pain medications fairly regularly Advised that this is something we cannot continue much longer Since patient is going out of town for conference and will be using her cane instead of crutches I gave a prescription for Norco with fill on or after date on February 10.  Refer to podiatry for care

## 2016-01-12 NOTE — Assessment & Plan Note (Signed)
Stable at this time. Patient left her job and feels like she is in a good situation currently Due to taking the Norco consistently she has not been taking the Valium

## 2016-01-12 NOTE — Assessment & Plan Note (Signed)
Stable  Refilled Zoloft

## 2016-01-13 ENCOUNTER — Telehealth: Payer: Self-pay | Admitting: Nurse Practitioner

## 2016-01-28 ENCOUNTER — Encounter: Payer: Self-pay | Admitting: Nurse Practitioner

## 2016-02-11 ENCOUNTER — Ambulatory Visit (INDEPENDENT_AMBULATORY_CARE_PROVIDER_SITE_OTHER): Payer: Self-pay | Admitting: Podiatry

## 2016-02-11 NOTE — Progress Notes (Signed)
Patient ID: Lori Molina, female   DOB: 11/12/1961, 55 y.o.   MRN: [id number]  No show

## 2016-03-22 ENCOUNTER — Other Ambulatory Visit: Payer: Self-pay | Admitting: Nurse Practitioner

## 2016-03-22 NOTE — Telephone Encounter (Signed)
Refilled 12/2015. Last seen 01/07/16 for anxiety. Please advise?

## 2016-03-23 NOTE — Telephone Encounter (Signed)
Faxed

## 2016-04-05 ENCOUNTER — Other Ambulatory Visit: Payer: Self-pay | Admitting: Nurse Practitioner

## 2016-04-05 NOTE — Telephone Encounter (Signed)
Pt requesting refill. Last ov 01/07/16, last filled 01/07/16. Ok to fill?

## 2016-06-24 ENCOUNTER — Other Ambulatory Visit: Payer: Self-pay | Admitting: *Deleted

## 2016-06-24 MED ORDER — OMEPRAZOLE 20 MG PO CPDR: 20.0000 mg | DELAYED_RELEASE_CAPSULE | Freq: Every day | ORAL | Status: DC

## 2016-07-12 ENCOUNTER — Ambulatory Visit (INDEPENDENT_AMBULATORY_CARE_PROVIDER_SITE_OTHER): Payer: BLUE CROSS/BLUE SHIELD | Admitting: Family

## 2016-07-12 ENCOUNTER — Encounter: Payer: Self-pay | Admitting: Family

## 2016-07-12 VITALS — BP 124/70 | HR 89 | Temp 98.3°F | Wt 147.4 lb

## 2016-07-12 DIAGNOSIS — F411 Generalized anxiety disorder: Secondary | ICD-10-CM | POA: Diagnosis not present

## 2016-07-12 DIAGNOSIS — R350 Frequency of micturition: Secondary | ICD-10-CM | POA: Diagnosis not present

## 2016-07-12 DIAGNOSIS — Z0001 Encounter for general adult medical examination with abnormal findings: Secondary | ICD-10-CM | POA: Diagnosis not present

## 2016-07-12 DIAGNOSIS — M542 Cervicalgia: Secondary | ICD-10-CM

## 2016-07-12 DIAGNOSIS — R6889 Other general symptoms and signs: Secondary | ICD-10-CM | POA: Diagnosis not present

## 2016-07-12 DIAGNOSIS — G8929 Other chronic pain: Secondary | ICD-10-CM | POA: Diagnosis not present

## 2016-07-12 DIAGNOSIS — R319 Hematuria, unspecified: Secondary | ICD-10-CM

## 2016-07-12 DIAGNOSIS — I10 Essential (primary) hypertension: Secondary | ICD-10-CM | POA: Diagnosis not present

## 2016-07-12 LAB — LIPID PANEL
CHOLESTEROL: 262 mg/dL — AB (ref 0–200)
HDL: 84 mg/dL (ref >39.00)
LDL Cholesterol: 160 mg/dL — ABNORMAL HIGH (ref 0–99)
NONHDL: 178.25
TRIGLYCERIDES: 89 mg/dL (ref 0.0–149.0)
Total CHOL/HDL Ratio: 3
VLDL: 17.8 mg/dL (ref 0.0–40.0)

## 2016-07-12 LAB — CBC WITH DIFFERENTIAL/PLATELET
BASOS PCT: 0.7 % (ref 0.0–3.0)
Basophils Absolute: 0 10*3/uL (ref 0.0–0.1)
EOS PCT: 6.6 % — AB (ref 0.0–5.0)
Eosinophils Absolute: 0.4 10*3/uL (ref 0.0–0.7)
HEMATOCRIT: 36.5 % (ref 36.0–46.0)
HEMOGLOBIN: 12.7 g/dL (ref 12.0–15.0)
LYMPHS PCT: 28 % (ref 12.0–46.0)
Lymphs Abs: 1.6 10*3/uL (ref 0.7–4.0)
MCHC: 34.7 g/dL (ref 30.0–36.0)
MCV: 105.2 fl — AB (ref 78.0–100.0)
MONOS PCT: 9.4 % (ref 3.0–12.0)
Monocytes Absolute: 0.5 10*3/uL (ref 0.1–1.0)
NEUTROS ABS: 3.1 10*3/uL (ref 1.4–7.7)
Neutrophils Relative %: 55.3 % (ref 43.0–77.0)
Platelets: 444 10*3/uL — ABNORMAL HIGH (ref 150.0–400.0)
RBC: 3.47 Mil/uL — ABNORMAL LOW (ref 3.87–5.11)
RDW: 12.4 % (ref 11.5–15.5)
WBC: 5.6 10*3/uL (ref 4.0–10.5)

## 2016-07-12 LAB — COMPREHENSIVE METABOLIC PANEL
ALBUMIN: 4.3 g/dL (ref 3.5–5.2)
ALT: 15 U/L (ref 0–35)
AST: 18 U/L (ref 0–37)
Alkaline Phosphatase: 79 U/L (ref 39–117)
BUN: 16 mg/dL (ref 6–23)
CALCIUM: 9.5 mg/dL (ref 8.4–10.5)
CHLORIDE: 100 meq/L (ref 96–112)
CO2: 25 mEq/L (ref 19–32)
CREATININE: 0.78 mg/dL (ref 0.40–1.20)
GFR: 80.76 mL/min (ref >60.00)
Glucose, Bld: 92 mg/dL (ref 70–99)
POTASSIUM: 3.9 meq/L (ref 3.5–5.1)
Sodium: 134 mEq/L — ABNORMAL LOW (ref 135–145)
Total Bilirubin: 0.3 mg/dL (ref 0.2–1.2)
Total Protein: 6.8 g/dL (ref 6.0–8.3)

## 2016-07-12 LAB — POCT URINALYSIS DIP (MANUAL ENTRY)
Bilirubin, UA: NEGATIVE
Glucose, UA: NEGATIVE
Ketones, POC UA: NEGATIVE
Leukocytes, UA: NEGATIVE
NITRITE UA: NEGATIVE
PH UA: 5
PROTEIN UA: NEGATIVE
Spec Grav, UA: 1.01
UROBILINOGEN UA: 0.2

## 2016-07-12 LAB — TSH: TSH: 2.56 u[IU]/mL (ref 0.35–4.50)

## 2016-07-12 LAB — HEMOGLOBIN A1C: HEMOGLOBIN A1C: 5 % (ref 4.6–6.5)

## 2016-07-12 MED ORDER — OXYBUTYNIN CHLORIDE 5 MG PO TABS: 2.5000 mg | ORAL_TABLET | Freq: Two times a day (BID) | ORAL | 0 refills | Status: DC

## 2016-07-12 MED ORDER — DICLOFENAC SODIUM 75 MG PO TBEC: 75.0000 mg | DELAYED_RELEASE_TABLET | Freq: Two times a day (BID) | ORAL | 0 refills | Status: DC
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
## 2016-07-12 NOTE — Assessment & Plan Note (Signed)
Stable, at baseline. Takes Valium occasionally and as prescribed.

## 2016-07-12 NOTE — Assessment & Plan Note (Addendum)
Due for colonoscopy, mammogram.We have no record her tetanus shot however patient politely declines today due to cost. She also declines colonoscopy today due to cost. I ordered mammogram. Pap smear is due next year Screening labs ordered. Encouraged smoking cessation.
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
## 2016-07-12 NOTE — Addendum Note (Signed)
Addended by: Warden FillersWRIGHT, Zada Haser S on: 07/12/2016 09:10 AM   Modules accepted: Orders

## 2016-07-12 NOTE — Assessment & Plan Note (Addendum)
Stable, controlled.  Continue current regimen 

## 2016-07-12 NOTE — Patient Instructions (Addendum)
Pleasure meeting you.  Trial of medication for bladder.   Over-the-counter medications you may try for arthritic pain include:   ThermaCare patches   Capsaicin cream   Icy hot  Home exercises as we discussed below/handout.  If conservative treatment doesn't yield results, we will consider physical therapy, consult to Sports Medicine/Orthopedics for further evaluation, and imaging.   If there is no improvement in your symptoms, or if there is any worsening of symptoms, or if you have any additional concerns, please return for re-evaluation; or, if we are closed, consider going to the Emergency Room for evaluation if symptoms urgent.  Neurogenic Bladder Neurogenic bladder is a bladder control disorder. It is caused by problems with the nerves that control your bladder. This condition may make your bladder overactive or underactive. You may have trouble holding your urine or passing your urine (urinating). The bladder is a hollow organ in the lower part of your abdomen. It stores urine after the urine is made by your kidneys. Normally, when your bladder is not full, the muscles that control your bladder are relaxed. When your bladder fills with urine, nerve signals are sent to your brain, indicating that your bladder is full. Your brain then sends signals through your spinal cord to the muscles in your bladder that start and stop urine flow. If you have neurogenic bladder, the nerves and muscles do not work together the way they should. CAUSES  Any kind of nerve damage or condition that disrupts the signals from your brain to your bladder can cause neurogenic bladder. Many things can cause these nerve problems. They include:  A disease that affects the nervous system, such as:  Alzheimer disease.  Cerebral palsy.  Multiple sclerosis.  Diabetes.  Parkinson disease.  Damage to your brain or spinal cord from:  Trauma.  Tumor.  Infection.  Surgery.  Alcohol  abuse.  Stroke.  A spinal cord birth defect. RISK FACTORS Having nerve damage or a nerve disorder puts you at risk for neurogenic bladder. SIGNS AND SYMPTOMS  Signs and symptoms of neurogenic bladder include:  Leaking or gushing urine (incontinence).  A sudden, strong urge to pass urine (urgency).  Frequent urination during the day and night.  Being unable to empty your bladder completely (urinary retention).  Frequent urinary tract infections. DIAGNOSIS  Your health care provider may diagnose neurogenic bladder based on your symptoms and medical history. A physical exam will also be done. You may be asked to keep a record of your bladder symptoms and the times that you urinate (bladder diary). Your health care provider may also do several tests to help diagnose neurogenic bladder, including:  A urine test to check for infection.  A bladder scan after you urinate to see how much urine is left in your bladder.  Various tests to measure your urine flow and see how well the flow is controlled (urodynamic tests).  A procedure that involves using a tool that is like a very thin telescope to look through your urethra into your bladder (cystoscopy). A health care provider who specializes in the urinary tract (urologist) may do this test.  Imaging tests of your brain or spine. TREATMENT  Treatment depends on the cause of your neurogenic bladder and the symptoms you are having. Work closely with your health care provider to find the treatments that will improve your quality of life. Treatment options include:  Learning ways to control when you urinate, such as:  Urinating at scheduled times.  Training yourself to  delay urination.  Doing exercises (Kegel exercises) to strengthen the muscles that control urine flow.  Avoiding foods or drinks that make your symptoms worse.  Taking medicines to:  Stimulate an underactive bladder.  Relax an overactive bladder.  Treat a urinary  tract infection.  Learning how to use a thin tube (catheter) to empty your bladder. A catheter is a hollow tube that you pass through your urethra.  Procedures to stimulate the nerves that control your bladder.  Surgical procedures if other treatments do not help. HOME CARE INSTRUCTIONS   Keep a bladder diary to find out which foods, liquids, or activities make your symptoms worse.  Use your bladder diary to schedule bathroom trips. If you are away from home, plan to be near a bathroom when your schedule says you need one.  Do Kegel exercises to strengthen the muscles that control urination. These muscles are the ones you use to try to hold urine when you need to go. To do Kegel exercises:  Squeeze these muscles tight and hold for about 10 seconds.  Repeat three times.  Do these exercises often during the day when you do not have to urinate.  Limit your drinking of beverages that stimulate urination. These include soda, coffee, and tea.  After urinating, wait a few minutes and try again (double voiding).  Make sure you urinate just before you leave the house and just before you go to bed.  Take medicines only as directed by your health care provider.  Keep all follow-up visits as directed by your health care provider. This is important. SEEK MEDICAL CARE IF:   You are having a hard time controlling your symptoms.  Your symptoms are getting worse.  You have signs of a urinary tract infection:  A burning feeling when you urinate.  Chills.  Fever. SEEK IMMEDIATE MEDICAL CARE IF:  You cannot pass urine.    This information is not intended to replace advice given to you by your health care provider. Make sure you discuss any questions you have with your health care provider.   Document Released: 06/04/2007 Document Revised: 12/12/2014 Document Reviewed: 03/04/2014 Elsevier Interactive Patient Education [employer]

## 2016-07-12 NOTE — Assessment & Plan Note (Addendum)
Suspect overactive bladder. Alternately, related to diuretic for BP. Trial of antimuscarinic and f/u 2 months.  Pending urinalysis to evaluate for alternate causes such as infection.

## 2016-07-12 NOTE — Progress Notes (Signed)
Pre visit review using our clinic review tool, if applicable. No additional management support is needed unless otherwise documented below in the visit note. 

## 2016-07-13 ENCOUNTER — Telehealth: Payer: Self-pay | Admitting: Family

## 2016-07-13 DIAGNOSIS — R7989 Other specified abnormal findings of blood chemistry: Secondary | ICD-10-CM

## 2016-07-13 DIAGNOSIS — R319 Hematuria, unspecified: Secondary | ICD-10-CM

## 2016-07-13 DIAGNOSIS — D539 Nutritional anemia, unspecified: Secondary | ICD-10-CM

## 2016-07-13 DIAGNOSIS — M542 Cervicalgia: Secondary | ICD-10-CM

## 2016-07-13 DIAGNOSIS — F411 Generalized anxiety disorder: Secondary | ICD-10-CM

## 2016-07-13 DIAGNOSIS — E785 Hyperlipidemia, unspecified: Secondary | ICD-10-CM

## 2016-07-13 LAB — HEPATITIS C ANTIBODY: HCV AB: NEGATIVE

## 2016-07-13 MED ORDER — ATORVASTATIN CALCIUM 10 MG PO TABS: 10.0000 mg | ORAL_TABLET | Freq: Every day | ORAL | 4 refills | Status: AC

## 2016-07-13 NOTE — Telephone Encounter (Addendum)
Please call patient and let her know that she has blood in her urine which is abnormal. I have ordered another urine test and want her to come back in one week ; if blood remains, I will place referral to urology for further workup.  I'm pending culture to see if this urinary tract infection however patient does not really have symptoms.  Otherwise thyroid, hemoglobin a1c. Kidney and liver function normal. Hep C negative.    CBC shows mild macrocytic anemia.Platelets elevated. . I have pended further lab work which she can have done when she comes back for urine.    cholesterol is high and I have sent rx to [facility] for treatment.I would like to follow her cholesterol in a couple of months. Please ensure f/u appt.

## 2016-07-14 ENCOUNTER — Other Ambulatory Visit: Payer: Self-pay

## 2016-07-14 LAB — URINE CULTURE

## 2016-07-14 MED ORDER — DICLOFENAC SODIUM 1 % TD GEL: 4.0000 g | Freq: Four times a day (QID) | TRANSDERMAL | 3 refills | Status: DC

## 2016-07-14 MED ORDER — SERTRALINE HCL 100 MG PO TABS: ORAL_TABLET | ORAL | 2 refills | Status: DC

## 2016-07-14 MED ORDER — DIAZEPAM 5 MG PO TABS: ORAL_TABLET | ORAL | 0 refills | Status: DC

## 2016-07-14 NOTE — Telephone Encounter (Signed)
Medication refill

## 2016-07-14 NOTE — Telephone Encounter (Signed)
Patient has been informed.  Patient has already picked up medication but she is wondering if the Voltaren can be change to the cream due to her stomach issues? Also she would like a refill of Valium, Last seen earlier this week, Last filled [id number]

## 2016-07-14 NOTE — Telephone Encounter (Signed)
Please call patient.   Refilled the Valium and changed Voltaren to Voltaren gel.

## 2016-07-15 NOTE — Telephone Encounter (Signed)
Patient has been informed.

## 2016-07-15 NOTE — Telephone Encounter (Signed)
Patient called and states she is returning your call. She is requesting a call back. Her number is [phone]. Thanks

## 2016-07-15 NOTE — Telephone Encounter (Signed)
Left message for patient to return call back.  

## 2016-07-20 ENCOUNTER — Other Ambulatory Visit: Payer: Self-pay

## 2016-07-25 ENCOUNTER — Other Ambulatory Visit: Payer: Self-pay | Admitting: Family

## 2016-07-25 ENCOUNTER — Telehealth: Payer: Self-pay

## 2016-07-25 ENCOUNTER — Ambulatory Visit
Admission: RE | Admit: 2016-07-25 | Discharge: 2016-07-25 | Disposition: A | Discharge status: Home / Self Care | Payer: BLUE CROSS/BLUE SHIELD | Source: Ambulatory Visit | Attending: Family | Admitting: Family

## 2016-07-25 DIAGNOSIS — Z1231 Encounter for screening mammogram for malignant neoplasm of breast: Secondary | ICD-10-CM | POA: Insufficient documentation

## 2016-07-25 DIAGNOSIS — Z0001 Encounter for general adult medical examination with abnormal findings: Secondary | ICD-10-CM

## 2016-07-25 MED ORDER — DICLOFENAC SODIUM 1 % TD GEL: 4.0000 g | Freq: Four times a day (QID) | TRANSDERMAL | 3 refills | Status: AC

## 2016-07-25 NOTE — Telephone Encounter (Signed)
Please advise, thanks.

## 2016-07-25 NOTE — Telephone Encounter (Signed)
Have sent 1% diclofenac to [facility].   Thanks!

## 2016-07-25 NOTE — Telephone Encounter (Signed)
Patient is asking if we can fill Diclofenac 1.5% cream for Elbow pain.   Past refill directions were Apply 1 gram to affected area 4 time daily.  Patient would like it to be sent to [facility] [place], [place].

## 2016-07-26 NOTE — Telephone Encounter (Signed)
Left message for patient to return call back.  

## 2016-07-26 NOTE — Telephone Encounter (Signed)
Patient has been informed. Also Patient was informed of results.  Patient understood and no questions, comments, or concerns at this time.

## 2016-08-04 ENCOUNTER — Other Ambulatory Visit: Payer: Self-pay

## 2016-08-21 ENCOUNTER — Other Ambulatory Visit: Payer: Self-pay | Admitting: Nurse Practitioner

## 2016-09-08 ENCOUNTER — Other Ambulatory Visit: Payer: Self-pay | Admitting: Internal Medicine

## 2016-09-14 ENCOUNTER — Ambulatory Visit: Payer: Self-pay | Admitting: Family

## 2016-09-29 ENCOUNTER — Encounter: Payer: Self-pay | Admitting: Family

## 2016-09-29 ENCOUNTER — Ambulatory Visit (INDEPENDENT_AMBULATORY_CARE_PROVIDER_SITE_OTHER): Payer: BLUE CROSS/BLUE SHIELD | Admitting: Family

## 2016-09-29 VITALS — BP 130/88 | HR 101 | Temp 98.1°F | Ht 63.0 in | Wt 148.0 lb

## 2016-09-29 DIAGNOSIS — F4323 Adjustment disorder with mixed anxiety and depressed mood: Secondary | ICD-10-CM

## 2016-09-29 DIAGNOSIS — F330 Major depressive disorder, recurrent, mild: Secondary | ICD-10-CM

## 2016-09-29 DIAGNOSIS — J209 Acute bronchitis, unspecified: Secondary | ICD-10-CM | POA: Diagnosis not present

## 2016-09-29 DIAGNOSIS — R350 Frequency of micturition: Secondary | ICD-10-CM | POA: Diagnosis not present

## 2016-09-29 DIAGNOSIS — F411 Generalized anxiety disorder: Secondary | ICD-10-CM | POA: Diagnosis not present

## 2016-09-29 DIAGNOSIS — I10 Essential (primary) hypertension: Secondary | ICD-10-CM

## 2016-09-29 MED ORDER — SERTRALINE HCL 50 MG PO TABS: 50.0000 mg | ORAL_TABLET | Freq: Every day | ORAL | 1 refills | Status: AC

## 2016-09-29 MED ORDER — PROMETHAZINE-DM 6.25-15 MG/5ML PO SYRP: 5.0000 mL | ORAL_SOLUTION | Freq: Every evening | ORAL | 1 refills | Status: DC | PRN

## 2016-09-29 MED ORDER — PREDNISONE 10 MG PO TABS: ORAL_TABLET | ORAL | 0 refills | Status: DC

## 2016-09-29 MED ORDER — LISINOPRIL-HYDROCHLOROTHIAZIDE 20-25 MG PO TABS: 1.0000 | ORAL_TABLET | Freq: Every day | ORAL | 1 refills | Status: DC

## 2016-09-29 MED ORDER — DIAZEPAM 5 MG PO TABS: ORAL_TABLET | ORAL | 0 refills | Status: DC

## 2016-09-29 MED ORDER — SERTRALINE HCL 100 MG PO TABS: ORAL_TABLET | ORAL | 1 refills | Status: AC

## 2016-09-29 MED ORDER — AMOXICILLIN 500 MG PO CAPS: 500.0000 mg | ORAL_CAPSULE | Freq: Two times a day (BID) | ORAL | 0 refills | Status: AC

## 2016-09-29 NOTE — Assessment & Plan Note (Signed)
Resolved. no longer needing medication. We'll continue to monitor.

## 2016-09-29 NOTE — Assessment & Plan Note (Signed)
Stable. Uses half tablet of Valium once- twice per week which we will as our baseline. Discussed risks of BZDs and patient agreed with long term plan of decreasing/discontinuing.

## 2016-09-29 NOTE — Progress Notes (Signed)
Subjective:    Patient ID: Lori Molina, female    DOB: November 08, 1961, 55 y.o.   MRN: [id number]  CC: Lori Molina is a 55 y.o. female who presents today for follow up.   HPI: Patient here for follow-up for new medication. 2 months ago we started her on oxybutynin for suspected overactive bladder. She stopped using medication as urinary symptoms resolved on their own.  She also complains today of cough, congestion for one week, worsening. No fever, chills. No wheezing or shortness of breath.  Smoker. Hasn't tried any medication. Cough worse in the  Morning and at night.   Anxiety- Stable. Always starts with a half tablet. Uses valium 1-2 per week.   Depression- started on Zoloft when went through divorce. Interested in increasing zoloft as she noticed lack of motivation of late. Trying to get organized to move to [facility].  Has been seeing minister at church every other week.     HISTORY:  Past Medical History:  Diagnosis Date  . Allergy   . Chicken pox   . Depression   . Frequent headaches   . Herniated disc, cervical 07/2015  . Hyperlipidemia   . Hypertension    Past Surgical History:  Procedure Laterality Date  . APPENDECTOMY    . DILATION AND CURETTAGE OF UTERUS     x 2   Family History  Problem Relation Age of Onset  . Hypertension Father   . Cancer Father     Allergies: Review of patient's allergies indicates no known allergies. Current Outpatient Prescriptions on File Prior to Visit  Medication Sig Dispense Refill  . atorvastatin (LIPITOR) 10 MG tablet Take 1 tablet (10 mg total) by mouth daily. 90 tablet 4  . diclofenac sodium (VOLTAREN) 1 % GEL Apply 4 g topically 4 (four) times daily. 1 Tube 3  . omeprazole (PRILOSEC) 20 MG capsule TAKE 1 CAPSULE (20 MG TOTAL) BY MOUTH DAILY. 30 capsule 2  . oxybutynin (DITROPAN) 5 MG tablet Take 0.5 tablets (2.5 mg total) by mouth 2 (two) times daily. 30 tablet 0   No current facility-administered medications on file  prior to visit.     Social History  Substance Use Topics  . Smoking status: Current Every Day Smoker    Packs/day: 0.50    Types: Cigarettes  . Smokeless tobacco: Never Used  . Alcohol use 0.0 oz/week     Comment: glass of wine nightly    Review of Systems  Constitutional: Negative for chills and fever.  HENT: Positive for congestion.   Respiratory: Positive for cough.   Cardiovascular: Negative for chest pain and palpitations.  Gastrointestinal: Negative for nausea and vomiting.  Genitourinary: Negative for frequency and urgency.  Psychiatric/Behavioral: Negative for sleep disturbance. The patient is nervous/anxious.       Objective:    BP 130/88   Pulse (!) 101   Temp 98.1 F (36.7 C) (Oral)   Ht 5\' 3"  (1.6 m)   Wt 148 lb (67.1 kg)   SpO2 97%   BMI 26.22 kg/m  BP Readings from Last 3 Encounters:  09/29/16 130/88  07/12/16 124/70  01/07/16 104/70   Wt Readings from Last 3 Encounters:  09/29/16 148 lb (67.1 kg)  07/12/16 147 lb 6.4 oz (66.9 kg)  01/07/16 137 lb 8 oz (62.4 kg)    Physical Exam  Constitutional: She appears well-developed and well-nourished.  HENT:  Head: Normocephalic and atraumatic.  Right Ear: Hearing, tympanic membrane, external ear and ear canal normal.  No drainage, swelling or tenderness. No foreign bodies. Tympanic membrane is not erythematous and not bulging. No middle ear effusion. No decreased hearing is noted.  Left Ear: Hearing, tympanic membrane, external ear and ear canal normal. No drainage, swelling or tenderness. No foreign bodies. Tympanic membrane is not erythematous and not bulging.  No middle ear effusion. No decreased hearing is noted.  Nose: Rhinorrhea present. Right sinus exhibits no maxillary sinus tenderness and no frontal sinus tenderness. Left sinus exhibits no maxillary sinus tenderness and no frontal sinus tenderness.  Mouth/Throat: Uvula is midline, oropharynx is clear and moist and mucous membranes are normal. No  oropharyngeal exudate, posterior oropharyngeal edema, posterior oropharyngeal erythema or tonsillar abscesses.  Eyes: Conjunctivae are normal.  Cardiovascular: Regular rhythm, normal heart sounds and normal pulses.   Pulmonary/Chest: Effort normal and breath sounds normal. She has no wheezes. She has no rhonchi. She has no rales.  Lymphadenopathy:       Head (right side): No submental, no submandibular, no tonsillar, no preauricular, no posterior auricular and no occipital adenopathy present.       Head (left side): No submental, no submandibular, no tonsillar, no preauricular, no posterior auricular and no occipital adenopathy present.    She has no cervical adenopathy.  Neurological: She is alert.  Skin: Skin is warm and dry.  Psychiatric: She has a normal mood and affect. Her speech is normal and behavior is normal. Thought content normal.  Vitals reviewed.      Assessment & Plan:   Problem List Items Addressed This Visit      Cardiovascular and Mediastinum   Benign essential HTN    Stable. Will continue current regimen.      Relevant Medications   lisinopril-hydrochlorothiazide (PRINZIDE,ZESTORETIC) 20-25 MG tablet     Respiratory   Acute bronchitis - Primary    Afebrile. SaO2 97. Based on duration of symptoms and history of smoking, we'll start antibiotic. Short course of oral prednisone. Cough syrup as needed at bedtime. Return precautions given.      Relevant Medications   amoxicillin (AMOXIL) 500 MG capsule   promethazine-dextromethorphan (PROMETHAZINE-DM) 6.25-15 MG/5ML syrup   predniSONE (DELTASONE) 10 MG tablet     Other   Depression    Worsening. Will trial 150 mg Zoloft. F/u in 3 months.       Relevant Medications   diazepam (VALIUM) 5 MG tablet   sertraline (ZOLOFT) 100 MG tablet   sertraline (ZOLOFT) 50 MG tablet   Generalized anxiety disorder    Stable. Uses half tablet of Valium once- twice per week which we will as our baseline. Discussed risks of BZDs  and patient agreed with long term plan of decreasing/discontinuing.       Urinary frequency    Resolved. no longer needing medication. We'll continue to monitor.       Other Visit Diagnoses    Anxiety state       Relevant Medications   diazepam (VALIUM) 5 MG tablet   sertraline (ZOLOFT) 100 MG tablet   sertraline (ZOLOFT) 50 MG tablet   Adjustment disorder with mixed anxiety and depressed mood       Relevant Medications   sertraline (ZOLOFT) 50 MG tablet       I am having Ms. Advani start on amoxicillin, promethazine-dextromethorphan, predniSONE, and sertraline. I am also having her maintain her oxybutynin, atorvastatin, diclofenac sodium, omeprazole, diazepam, lisinopril-hydrochlorothiazide, and sertraline.   Meds ordered this encounter  Medications  . diazepam (VALIUM) 5 MG tablet  Sig: TAKE 1 TABLET BY MOUTH AS NEEDED FOR ANXIETY. Max one per day.    Dispense:  30 tablet    Refill:  0  . lisinopril-hydrochlorothiazide (PRINZIDE,ZESTORETIC) 20-25 MG tablet    Sig: Take 1 tablet by mouth daily.    Dispense:  90 tablet    Refill:  1  . sertraline (ZOLOFT) 100 MG tablet    Sig: TAKE 1 TABLET (100 MG TOTAL) BY MOUTH DAILY.    Dispense:  90 tablet    Refill:  1  . amoxicillin (AMOXIL) 500 MG capsule    Sig: Take 1 capsule (500 mg total) by mouth 2 (two) times daily.    Dispense:  14 capsule    Refill:  0    Order Specific Question:   Supervising Provider    Answer:   Duncan DullULLO, TERESA L [[id number]]  . promethazine-dextromethorphan (PROMETHAZINE-DM) 6.25-15 MG/5ML syrup    Sig: Take 5 mLs by mouth at bedtime as needed for cough.    Dispense:  40 mL    Refill:  1    Order Specific Question:   Supervising Provider    Answer:   Darrick HuntsmanULLO, TERESA L [[id number]]  . predniSONE (DELTASONE) 10 MG tablet    Sig: Take 40 mg by mouth on day 1, then taper 10 mg daily until gone    Dispense:  10 tablet    Refill:  0    Order Specific Question:   Supervising Provider    Answer:   Duncan DullULLO,  TERESA L [[id number]]  . sertraline (ZOLOFT) 50 MG tablet    Sig: Take 1 tablet (50 mg total) by mouth at bedtime.    Dispense:  90 tablet    Refill:  1    Order Specific Question:   Supervising Provider    Answer:   Sherlene ShamsULLO, TERESA L [[id number]]    Return precautions given.   Risks, benefits, and alternatives of the medications and treatment plan prescribed today were discussed, and patient expressed understanding.   Education regarding symptom management and diagnosis given to patient on AVS.  Continue to follow with Rennie PlowmanMargaret Emberlynn Riggan, FNP for routine health maintenance.   Celine Manslaire E Lucchese and I agreed with plan.   Rennie PlowmanMargaret Kinneth Fujiwara, FNP

## 2016-09-29 NOTE — Patient Instructions (Signed)
Let me know if you are not better.  Increase intake of clear fluids. Congestion is best treated by hydration, when mucus is wetter, it is thinner, less sticky, and easier to expel from the body, either through coughing up drainage, or by blowing your nose.   Get plenty of rest.   Use saline nasal drops and blow your nose frequently. Run a humidifier at night and elevate the head of the bed. Vicks Vapor rub will help with congestion and cough. Steam showers and sinus massage for congestion.   Use Acetaminophen or Ibuprofen as needed for fever or pain. Avoid second hand smoke. Even the smallest exposure will worsen symptoms.   Over the counter medications you can try include Delsym for cough, a decongestant for congestion, and Mucinex or Robitussin as an expectorant. Be sure to just get the plain Mucinex or Robitussin that just has one medication (Guaifenesen). We don't recommend the combination products. Note, be sure to drink two glasses of water with each dose of Mucinex as the medication will not work well without adequate hydration.   You can also try a teaspoon of honey to see if this will help reduce cough. Throat lozenges can sometimes be beneficial as well.    This illness will typically last 7 - 10 days.   Please follow up with our clinic if you develop a fever greater than 101 F, symptoms worsen, or do not resolve in the next week.

## 2016-09-29 NOTE — Assessment & Plan Note (Signed)
Worsening. Will trial 150 mg Zoloft. F/u in 3 months.

## 2016-09-29 NOTE — Assessment & Plan Note (Signed)
Afebrile. SaO2 97. Based on duration of symptoms and history of smoking, we'll start antibiotic. Short course of oral prednisone. Cough syrup as needed at bedtime. Return precautions given.

## 2016-09-29 NOTE — Assessment & Plan Note (Signed)
Stable. Will continue current regimen.  

## 2016-09-29 NOTE — Progress Notes (Signed)
Pre visit review using our clinic review tool, if applicable. No additional management support is needed unless otherwise documented below in the visit note. 

## 2016-09-30 NOTE — Progress Notes (Signed)
Left message for patient to call back  

## 2016-12-11 ENCOUNTER — Other Ambulatory Visit: Payer: Self-pay | Admitting: Family

## 2016-12-20 ENCOUNTER — Encounter: Payer: Self-pay | Admitting: Family

## 2016-12-20 ENCOUNTER — Ambulatory Visit (INDEPENDENT_AMBULATORY_CARE_PROVIDER_SITE_OTHER): Payer: BLUE CROSS/BLUE SHIELD | Admitting: Family

## 2016-12-20 VITALS — BP 122/78 | HR 98 | Temp 97.9°F | Ht 63.0 in | Wt 145.0 lb

## 2016-12-20 DIAGNOSIS — M542 Cervicalgia: Secondary | ICD-10-CM | POA: Diagnosis not present

## 2016-12-20 DIAGNOSIS — M545 Low back pain, unspecified: Secondary | ICD-10-CM

## 2016-12-20 DIAGNOSIS — G8929 Other chronic pain: Secondary | ICD-10-CM

## 2016-12-20 DIAGNOSIS — K219 Gastro-esophageal reflux disease without esophagitis: Secondary | ICD-10-CM

## 2016-12-20 DIAGNOSIS — F411 Generalized anxiety disorder: Secondary | ICD-10-CM | POA: Diagnosis not present

## 2016-12-20 MED ORDER — DIAZEPAM 5 MG PO TABS: ORAL_TABLET | ORAL | 1 refills | Status: AC

## 2016-12-20 MED ORDER — PREDNISONE 10 MG PO TABS: ORAL_TABLET | ORAL | 0 refills | Status: AC

## 2016-12-20 MED ORDER — OMEPRAZOLE 20 MG PO CPDR: 20.0000 mg | DELAYED_RELEASE_CAPSULE | Freq: Every day | ORAL | 1 refills | Status: DC

## 2016-12-20 NOTE — Progress Notes (Signed)
Subjective:    Patient ID: Lori Molina, female    DOB: Oct 27, 1961, [age] y.o.   MRN: [id number]  CC: Lori Molina is a 56 y.o. female who presents today for follow up.   [place] and would like refills, valium, prilosec.   Anxiety well controlled.  GERD well controlled.   Chronic neck and low back pain for years, aggravated recently with packing, lifting. Describes as intermittent dull ache.  Had been on flexeril however we discontinued since on valium. Cannot tolerate NSAIDs. No urinary incontinence, saddle anesthesia, numbness, tingling in legs. Has been trying bengay, heating pad. voltaren gel with some relief. Would like pain medication for a couple of days.           HISTORY:  Past Medical History:  Diagnosis Date  . Allergy   . Chicken pox   . Depression   . Frequent headaches   . Herniated disc, cervical 07/2015  . Hyperlipidemia   . Hypertension    Past Surgical History:  Procedure Laterality Date  . APPENDECTOMY    . DILATION AND CURETTAGE OF UTERUS     x 2   Family History  Problem Relation Age of Onset  . Hypertension Father   . Cancer Father     Allergies: Patient has no known allergies. Current Outpatient Prescriptions on File Prior to Visit  Medication Sig Dispense Refill  . amoxicillin (AMOXIL) 500 MG capsule Take 1 capsule (500 mg total) by mouth 2 (two) times daily. 14 capsule 0  . atorvastatin (LIPITOR) 10 MG tablet Take 1 tablet (10 mg total) by mouth daily. 90 tablet 4  . diclofenac sodium (VOLTAREN) 1 % GEL Apply 4 g topically 4 (four) times daily. 1 Tube 3  . lisinopril-hydrochlorothiazide (PRINZIDE,ZESTORETIC) 20-25 MG tablet Take 1 tablet by mouth daily. 90 tablet 1  . sertraline (ZOLOFT) 100 MG tablet TAKE 1 TABLET (100 MG TOTAL) BY MOUTH DAILY. 90 tablet 1  . sertraline (ZOLOFT) 50 MG tablet Take 1 tablet (50 mg total) by mouth at bedtime. 90 tablet 1   No current facility-administered medications on file  prior to visit.     Social History  Substance Use Topics  . Smoking status: Current Every Day Smoker    Packs/day: 0.50    Types: Cigarettes  . Smokeless tobacco: Never Used  . Alcohol use 0.0 oz/week     Comment: glass of wine nightly    Review of Systems  Constitutional: Negative for chills and fever.  Respiratory: Negative for cough.   Cardiovascular: Negative for chest pain and palpitations.  Gastrointestinal: Negative for nausea and vomiting.  Musculoskeletal: Positive for back pain and neck pain.  Psychiatric/Behavioral: The patient is nervous/anxious.       Objective:    BP 122/78   Pulse 98   Temp 97.9 F (36.6 C) (Oral)   Ht 5\' 3"  (1.6 m)   Wt 145 lb (65.8 kg)   SpO2 95%   BMI 25.69 kg/m  BP Readings from Last 3 Encounters:  12/20/16 122/78  09/29/16 130/88  07/12/16 124/70   Wt Readings from Last 3 Encounters:  12/20/16 145 lb (65.8 kg)  09/29/16 148 lb (67.1 kg)  07/12/16 147 lb 6.4 oz (66.9 kg)    Physical Exam  Constitutional: She appears well-developed and well-nourished.  Eyes: Conjunctivae are normal.  Cardiovascular: Normal rate, regular rhythm, normal heart sounds and normal pulses.   Pulmonary/Chest: Effort normal and breath sounds normal. She has no  wheezes. She has no rhonchi. She has no rales.  Musculoskeletal:       Lumbar back: She exhibits tenderness. She exhibits normal range of motion, no bony tenderness, no swelling, no edema, no pain and no spasm.       Back:  Full range of motion with flexion, tension, lateral side bends. No bony tenderness. No pain, numbness, tingling elicited with single leg raise bilaterally.  Pain over right SI joint with palpation.   Right Hip: No limp or waddling gait. Full ROM with flexion and hip rotation in flexion.  No pain of lateral hip with  (flexion-abduction-external rotation) test. No pain with deep palpation of greater trochanter.    Neurological: She is alert. She has normal strength. No  sensory deficit.  Reflex Scores:      Patellar reflexes are 2+ on the right side and 2+ on the left side. Sensation and strength intact bilateral lower extremities.  Skin: Skin is warm and dry.  Psychiatric: She has a normal mood and affect. Her speech is normal and behavior is normal. Thought content normal.  Vitals reviewed.      Assessment & Plan:   Problem List Items Addressed This Visit      Digestive   GERD (gastroesophageal reflux disease)    Stable. Will continue current regimen.       Relevant Medications   omeprazole (PRILOSEC) 20 MG capsule     Other   Generalized anxiety disorder - Primary    Stable. Refilled today and made an exception and gave patient one refill due to move. Wanted to ensure she had time to establish with new provider and did not run out of medication.       Relevant Medications   diazepam (VALIUM) 5 MG tablet   Lumbago   Relevant Medications   predniSONE (DELTASONE) 10 MG tablet   Chronic neck pain    Neck Pain is chronic. 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 Patient will trial of prednisone to see if this helps. She also has valium to take nightly to help with any associated muscle spasm. Advised moist heat, gentle stretching.      Relevant Medications   predniSONE (DELTASONE) 10 MG tablet       I have discontinued [name]'s oxybutynin, promethazine-dextromethorphan, and predniSONE. I am also having her start on predniSONE. Additionally, I am having her maintain her atorvastatin, diclofenac sodium, lisinopril-hydrochlorothiazide, sertraline, amoxicillin, sertraline, diazepam, and omeprazole.   Meds ordered this encounter  Medications  . diazepam (VALIUM) 5 MG tablet    Sig: TAKE 1 TABLET BY MOUTH AS NEEDED FOR ANXIETY. Max one per day.    Dispense:  30 tablet    Refill:  1    Order Specific Question:   Supervising Provider    Answer:   Duncan Dull L [[id number]]  . DISCONTD: omeprazole  (PRILOSEC) 20 MG capsule    Sig: Take 1 capsule (20 mg total) by mouth daily.    Dispense:  90 capsule    Refill:  1    Order Specific Question:   Supervising Provider    Answer:   Darrick Huntsman, TERESA L [[id number]]  . predniSONE (DELTASONE) 10 MG tablet    Sig: Take 4 tablets ( total 40 mg) by mouth for 2 days; take 3 tablets ( total 30 mg) by mouth for 2 days; take 2 tablets ( total 20 mg) by mouth for 1 day; take 1 tablet ( total 10 mg) by mouth  for 1 day.    Dispense:  17 tablet    Refill:  0    Order Specific Question:   Supervising Provider    Answer:   Duncan Dull L [[id number]]  . omeprazole (PRILOSEC) 20 MG capsule    Sig: Take 1 capsule (20 mg total) by mouth daily.    Dispense:  90 capsule    Refill:  1    Order Specific Question:   Supervising Provider    Answer:   Sherlene Shams [[id number]]    Return precautions given.   Risks, benefits, and alternatives of the medications and treatment plan prescribed today were discussed, and patient expressed understanding.   Education regarding symptom management and diagnosis given to patient on AVS.  Continue to follow with Rennie Plowman, FNP for routine health maintenance.   Lori Mans and I agreed with plan.   Rennie Plowman, FNP

## 2016-12-20 NOTE — Assessment & Plan Note (Signed)
Stable. Will continue current regimen.  

## 2016-12-20 NOTE — Assessment & Plan Note (Signed)
Stable. Refilled today and made an exception and gave patient one refill due to move. Wanted to ensure she had time to establish with new provider and did not run out of medication.

## 2016-12-20 NOTE — Progress Notes (Signed)
Pre visit review using our clinic review tool, if applicable. No additional management support is needed unless otherwise documented below in the visit note. 

## 2016-12-20 NOTE — Assessment & Plan Note (Signed)
Neck Pain is chronic. 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 Patient will trial of prednisone to see if this helps. She also has valium to take nightly to help with any associated muscle spasm. Advised moist heat, gentle stretching.

## 2016-12-20 NOTE — Patient Instructions (Signed)
Good luck with move- you will be missed.   Trial of prednisone taper to see if this helps with low back pain. Suspect SI joint dysfunction.  Sacroiliac Joint Dysfunction Introduction Sacroiliac joint dysfunction is a condition that causes inflammation on one or both sides of the sacroiliac (SI) joint. The SI joint connects the lower part of the spine (sacrum) with the two upper portions of the pelvis (ilium). This condition causes deep aching or burning pain in the low back. In some cases, the pain may also spread into one or both buttocks or hips or spread down the legs. What are the causes? This condition may be caused by:  Pregnancy. During pregnancy, extra stress is put on the SI joints because the pelvis widens.  Injury, such as:  Car accidents.  Sport-related injuries.  Work-related injuries.  Having one leg that is shorter than the other.  Conditions that affect the joints, such as:  Rheumatoid arthritis.  Gout.  Psoriatic arthritis.  Joint infection (septic arthritis). Sometimes, the cause of SI joint dysfunction is not known. What are the signs or symptoms? Symptoms of this condition include:  Aching or burning pain in the lower back. The pain may also spread to other areas, such as:  Buttocks.  Groin.  Thighs and legs.  Muscle spasms in or around the painful areas.  Increased pain when standing, walking, running, stair climbing, bending, or lifting. How is this diagnosed? Your health care provider will do a physical exam and take your medical history. During the exam, the health care provider may move one or both of your legs to different positions to check for pain. Various tests may be done to help verify the diagnosis, including:  Imaging tests to look for other causes of pain. These may include:  MRI.  CT scan.  Bone scan.  Diagnostic injection. A numbing medicine is injected into the SI joint using a needle. If the pain is temporarily improved  or stopped after the injection, this can indicate that SI joint dysfunction is the problem. How is this treated? Treatment may vary depending on the cause and severity of your condition. Treatment options may include:  Applying ice or heat to the lower back area. This can help to reduce pain and muscle spasms.  Medicines to relieve pain or inflammation or to relax the muscles.  Wearing a back brace (sacroiliac brace) to help support the joint while your back is healing.  Physical therapy to increase muscle strength around the joint and flexibility at the joint. This may also involve learning proper body positions and ways of moving to relieve stress on the joint.  Direct manipulation of the SI joint.  Injections of steroid medicine into the joint in order to reduce pain and swelling.  Radiofrequency ablation to burn away nerves that are carrying pain messages from the joint.  Use of a device that provides electrical stimulation in order to reduce pain at the joint.  Surgery to put in screws and plates that limit or prevent joint motion. This is rare. Follow these instructions at home:  Rest as needed. Limit your activities as directed by your health care provider.  Take medicines only as directed by your health care provider.  If directed, apply ice to the affected area:  Put ice in a plastic bag.  Place a towel between your skin and the bag.  Leave the ice on for 20 minutes, 2-3 times per day.  Use a heating pad or a moist heat  pack as directed by your health care provider.  Exercise as directed by your health care provider or physical therapist.  Keep all follow-up visits as directed by your health care provider. This is important. Contact a health care provider if:  Your pain is not controlled with medicine.  You have a fever.  You have increasingly severe pain. Get help right away if:  You have weakness, numbness, or tingling in your legs or feet.  You lose  control of your bladder or bowel. This information is not intended to replace advice given to you by your health care provider. Make sure you discuss any questions you have with your health care provider. Document Released: 02/17/2009 Document Revised: 04/28/2016 Document Reviewed: 07/29/2014  2017 [employer]

## 2016-12-26 ENCOUNTER — Encounter: Payer: Self-pay | Admitting: Family

## 2016-12-26 ENCOUNTER — Telehealth: Payer: Self-pay | Admitting: Family

## 2016-12-26 DIAGNOSIS — M545 Low back pain: Secondary | ICD-10-CM

## 2016-12-26 MED ORDER — HYDROCODONE-ACETAMINOPHEN 5-325 MG PO TABS: 1.0000 | ORAL_TABLET | Freq: Four times a day (QID) | ORAL | 0 refills | Status: AC | PRN

## 2016-12-26 NOTE — Telephone Encounter (Signed)
See my chart message

## 2017-01-21 IMAGING — MG MM DIGITAL SCREENING BILAT W/ TOMO W/ CAD
9 of 12 series · 9 of 28 positions shown · non-contrast
Comparison: Previous exam(s).

CLINICAL DATA: Screening.

EXAM:
2D DIGITAL SCREENING BILATERAL MAMMOGRAM WITH CAD AND ADJUNCT TOMO

[R CC]
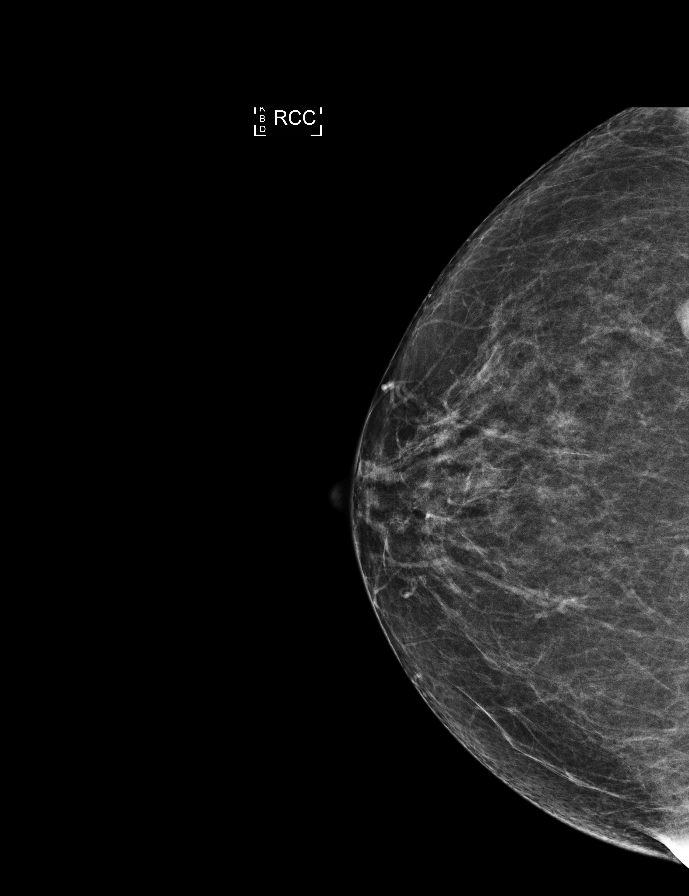

[R MLO synth-2D]
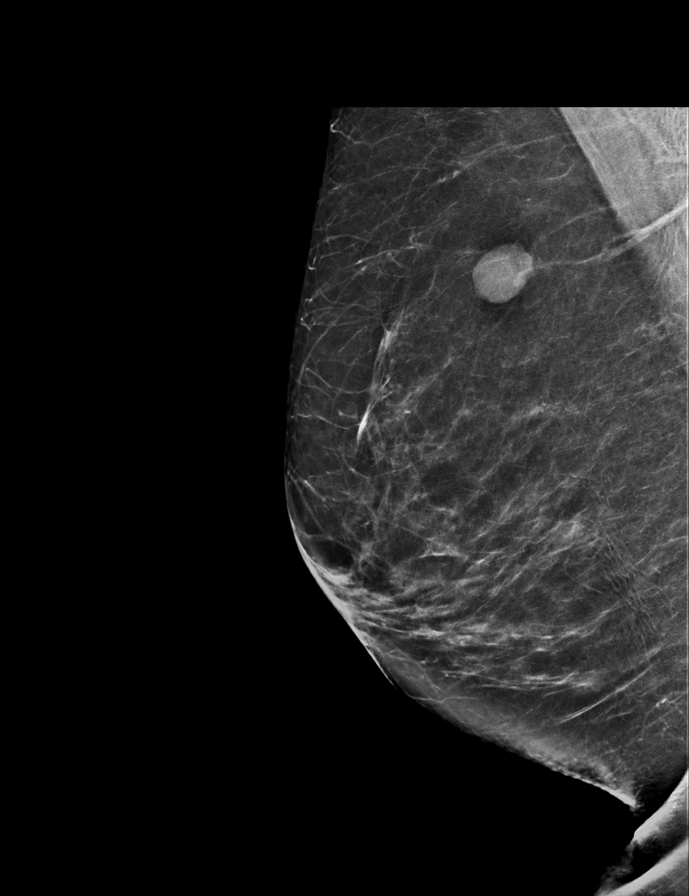

[L MLO]
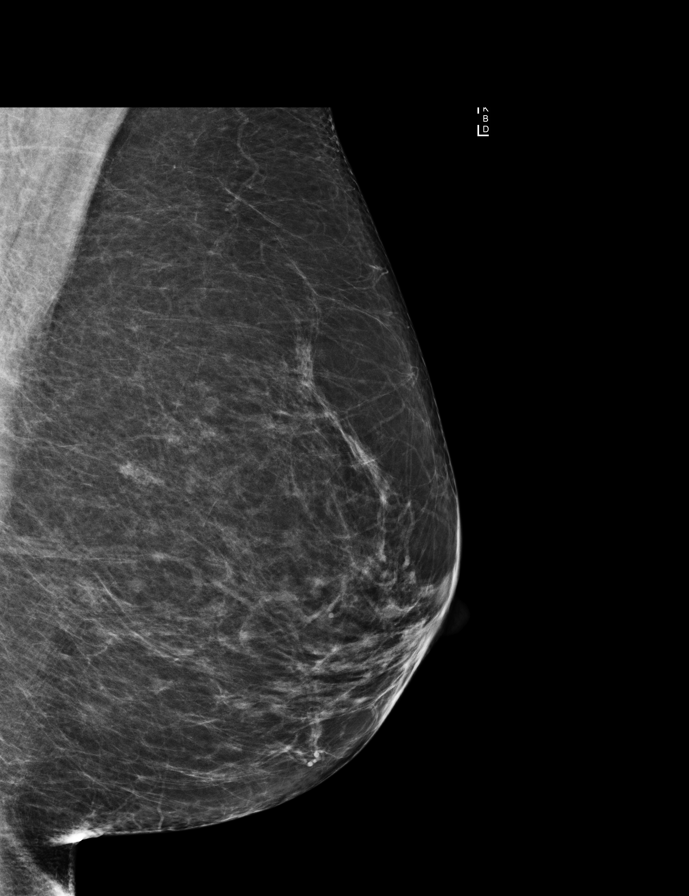

[R CC synth-2D]
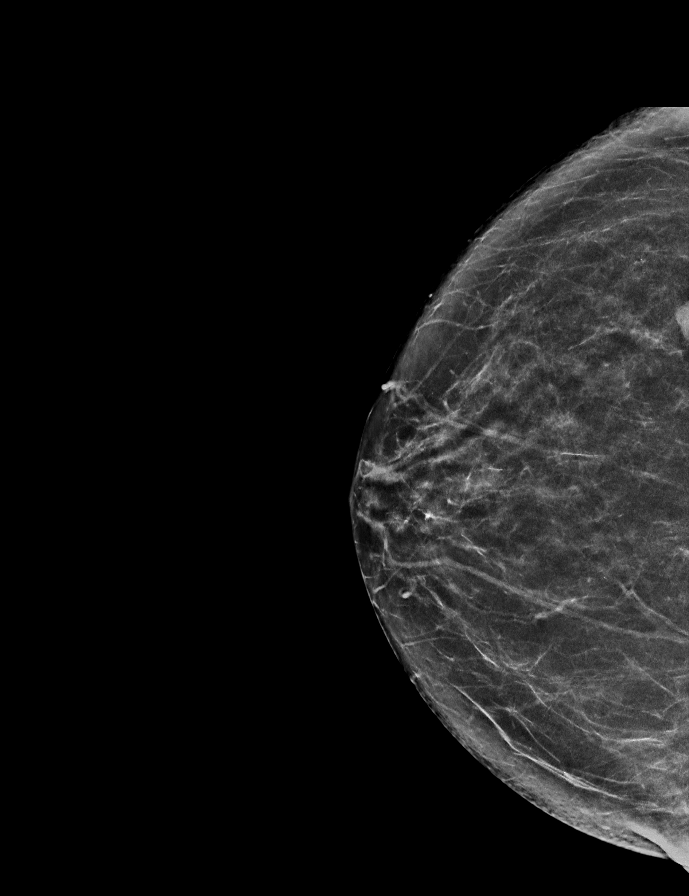

[L CC synth-2D]
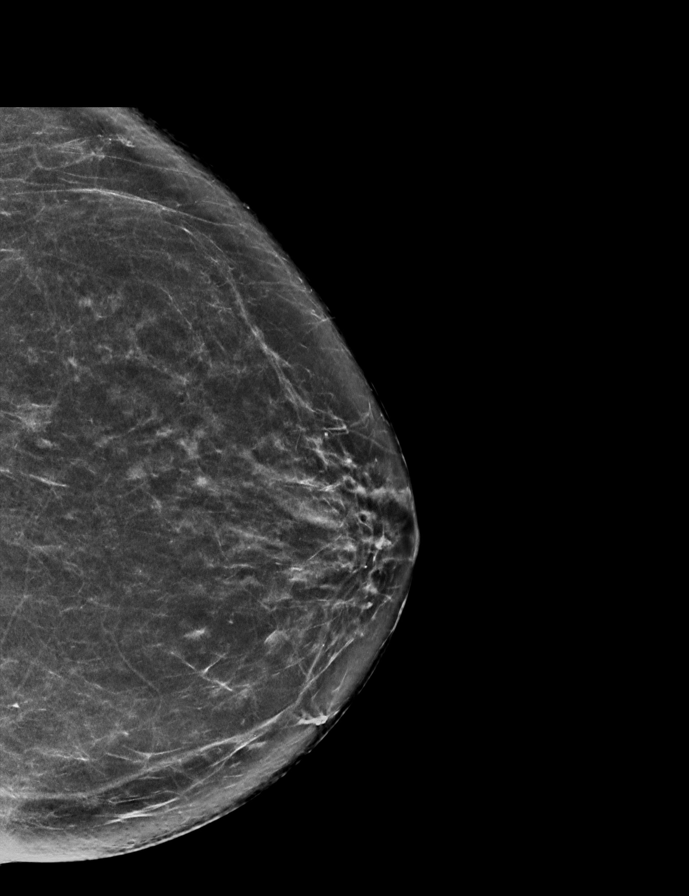

[L MLO synth-2D]
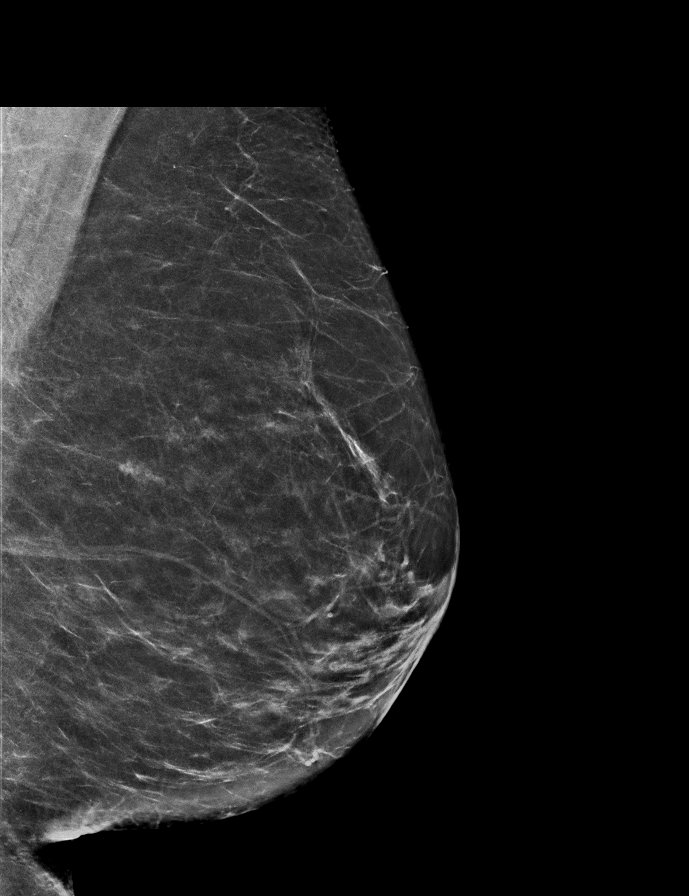

[R MLO]
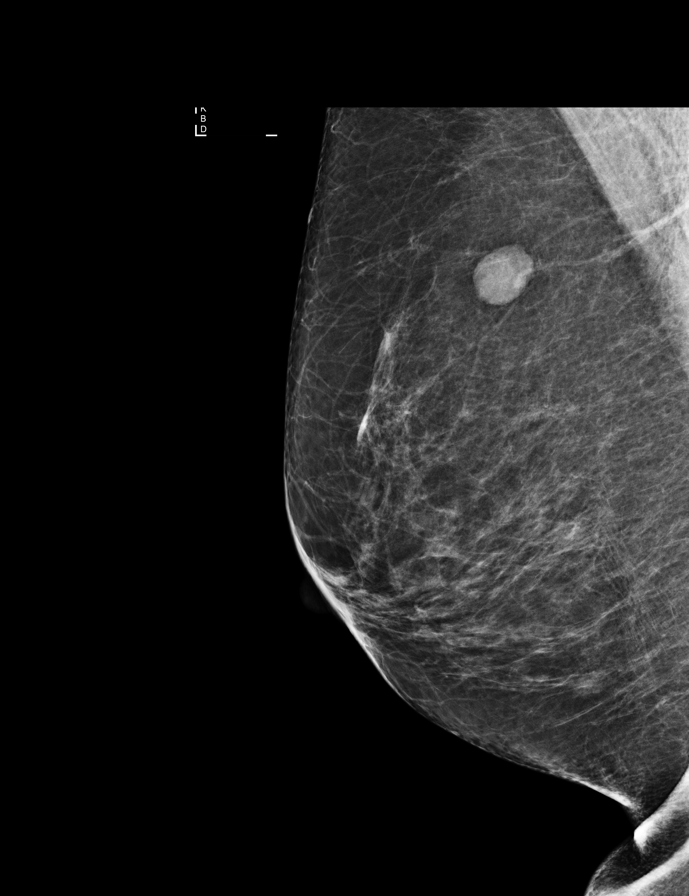

[L CC]
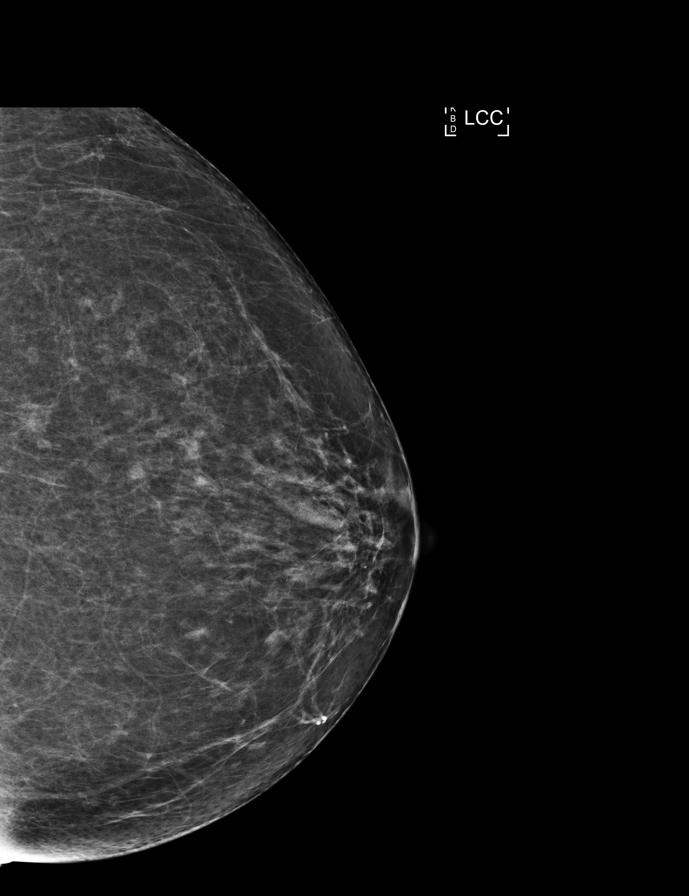

[R CC tomo · tomo slice 33/65.0]
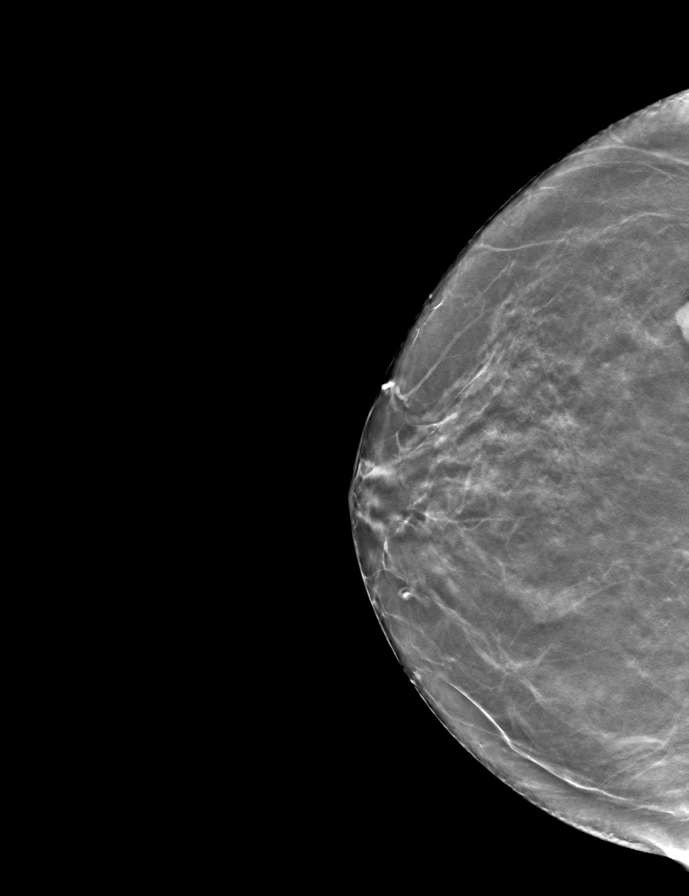

[9 of 28 positions shown; findings below may reference images not displayed]

ACR Breast Density Category b: There are scattered areas of
fibroglandular density.
FINDINGS: There are no findings suspicious for malignancy. Images were
processed with CAD.
IMPRESSION: No mammographic evidence of malignancy. A result letter of this
screening mammogram will be mailed directly to the patient.

RECOMMENDATION:
Screening mammogram in one year. (Code:97-6-RS4)

BI-RADS CATEGORY  1: Negative.

## 2017-03-23 ENCOUNTER — Other Ambulatory Visit: Payer: Self-pay | Admitting: Family

## 2017-03-23 DIAGNOSIS — F411 Generalized anxiety disorder: Secondary | ICD-10-CM

## 2017-10-19 ENCOUNTER — Other Ambulatory Visit: Payer: Self-pay | Admitting: Family

## 2017-10-19 DIAGNOSIS — I10 Essential (primary) hypertension: Secondary | ICD-10-CM

## 2017-11-19 ENCOUNTER — Other Ambulatory Visit: Payer: Self-pay | Admitting: Family

## 2017-11-19 DIAGNOSIS — K219 Gastro-esophageal reflux disease without esophagitis: Secondary | ICD-10-CM

## 2018-01-06 ENCOUNTER — Other Ambulatory Visit: Payer: Self-pay | Admitting: Family

## 2018-01-06 DIAGNOSIS — I10 Essential (primary) hypertension: Secondary | ICD-10-CM

## 2019-03-02 ENCOUNTER — Other Ambulatory Visit: Payer: Self-pay | Admitting: Family

## 2019-03-02 DIAGNOSIS — I10 Essential (primary) hypertension: Secondary | ICD-10-CM

## 2019-03-30 ENCOUNTER — Other Ambulatory Visit: Payer: Self-pay | Admitting: Family

## 2019-03-30 DIAGNOSIS — I10 Essential (primary) hypertension: Secondary | ICD-10-CM

## 2019-06-26 ENCOUNTER — Other Ambulatory Visit: Payer: Self-pay | Admitting: Family

## 2019-06-26 DIAGNOSIS — I10 Essential (primary) hypertension: Secondary | ICD-10-CM
